# Patient Record
Sex: Female | Born: 1970 | Race: Black or African American | Hispanic: No | Marital: Married | Smoking: Former smoker
Health system: Southern US, Community
[De-identification: ages and names within clinical notes are randomized; demographics above are authoritative.]

## PROBLEM LIST (undated history)

## (undated) DIAGNOSIS — K219 Gastro-esophageal reflux disease without esophagitis: Secondary | ICD-10-CM

## (undated) DIAGNOSIS — Z8619 Personal history of other infectious and parasitic diseases: Secondary | ICD-10-CM

## (undated) DIAGNOSIS — R519 Headache, unspecified: Secondary | ICD-10-CM

## (undated) DIAGNOSIS — I10 Essential (primary) hypertension: Secondary | ICD-10-CM

## (undated) DIAGNOSIS — Z973 Presence of spectacles and contact lenses: Secondary | ICD-10-CM

## (undated) DIAGNOSIS — D259 Leiomyoma of uterus, unspecified: Secondary | ICD-10-CM

## (undated) DIAGNOSIS — Z972 Presence of dental prosthetic device (complete) (partial): Secondary | ICD-10-CM

## (undated) DIAGNOSIS — R51 Headache: Secondary | ICD-10-CM

## (undated) HISTORY — DX: Gastro-esophageal reflux disease without esophagitis: K21.9

## (undated) HISTORY — PX: NO PAST SURGERIES: SHX2092

---

## 2010-02-04 ENCOUNTER — Ambulatory Visit: Payer: Self-pay | Admitting: Family Medicine

## 2010-02-04 DIAGNOSIS — H6690 Otitis media, unspecified, unspecified ear: Secondary | ICD-10-CM | POA: Insufficient documentation

## 2010-02-04 DIAGNOSIS — J018 Other acute sinusitis: Secondary | ICD-10-CM | POA: Insufficient documentation

## 2010-07-30 ENCOUNTER — Encounter: Admission: RE | Admit: 2010-07-30 | Discharge: 2010-07-30 | Payer: Self-pay | Admitting: Internal Medicine

## 2010-08-14 ENCOUNTER — Encounter: Admission: RE | Admit: 2010-08-14 | Discharge: 2010-08-14 | Payer: Self-pay | Admitting: Internal Medicine

## 2010-08-15 ENCOUNTER — Encounter: Admission: RE | Admit: 2010-08-15 | Discharge: 2010-08-15 | Payer: Self-pay | Admitting: Internal Medicine

## 2010-10-25 ENCOUNTER — Encounter: Payer: Self-pay | Admitting: Internal Medicine

## 2010-11-04 NOTE — Letter (Signed)
Summary: Out of Work  [facility]  [address]   [place], [place] [address]   Phone: [phone]  Fax: [phone]    Feb 04, 2010   Employee:  [name]    To Whom It May Concern:   For Medical reasons, please excuse the above named employee from work for the following dates:  Start:    End:    If you need additional information, please feel free to contact our office.         Sincerely,    Tacey Ruiz MD

## 2010-11-04 NOTE — Assessment & Plan Note (Signed)
Summary: SINUSES/JBB   Vital Signs:  Patient Profile:   40 Years Old Female CC:      Cold & URI symptoms Height:     64 inches Weight:      277 pounds BMI:     47.72 Temp:     97.8 degrees F oral Pulse rate:   80 / minute Pulse rhythm:   regular Resp:     18 per minute BP sitting:   134 / 80  (left arm)  Pt. in pain?   no  Vitals Entered By: Levonne Spiller EMT-P (Feb 04, 2010 3:04 PM)              Is Patient Diabetic? No Comments Pt. quit smoking 3 months prior. Pt. has hx GERD./RWT      Current Allergies: No known allergies History of Present Illness History from: patient Chief Complaint: Cold & URI symptoms History of Present Illness: Since Sunday has had headache - described as frontal pressure and pressure behind the nose. Says that the congestion and pressure comes and goes.  Has taken aleve and that worked only temporarily. Hasn't tried otc meds.  Slight rhinorrhea, horseness and sorethroat. No sneezing. No fever/chills.  Current Problems: OTITIS MEDIA, ACUTE (ICD-382.9) OTHER ACUTE SINUSITIS (ICD-461.8)   Current Meds PROTONIX 40 MG TBEC (PANTOPRAZOLE SODIUM)  ZITHROMAX Z-PAK 250 MG TABS (AZITHROMYCIN) as directed for infection  REVIEW OF SYSTEMS Constitutional Symptoms      Denies fever and chills.  Ear/Nose/Throat/Mouth       Complains of frequent runny nose and hoarseness.      Denies change in hearing, ear pain, dizziness, frequent nose bleeds, sinus problems, and sore throat.  Respiratory       Denies dry cough and shortness of breath.     Neurological       Complains of headaches.    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 sounds congested. Head: normocephalic, atraumatic Ears: inflamed left TM Nasal: swollen red turbinates with congestion Oral/Pharynx: +  thick white post nasal drainage.  Neck: neck supple,  trachea midline, no masses Chest/Lungs: no rales, wheezes, or rhonchi bilateral, breath sounds equal without effort Heart: regular rate and  rhythm, no murmur Tender to palp max sinuses no erythema or swelling.  Assessment New Problems: OTITIS MEDIA, ACUTE (ICD-382.9) OTHER ACUTE SINUSITIS (ICD-461.8)   Plan New Medications/Changes: ZITHROMAX Z-PAK 250 MG TABS (AZITHROMYCIN) as directed for infection  #1 pk x 0, 02/04/2010, Tacey Ruiz MD  New Orders: New Patient Level II [phone] Planning Comments:   Discussed taking otc decongestants and alleve for now. Discussed a nasal wash. Give that 2-3 days, if no relief then may take zpak as prescribed.  Follow Up: Follow up in 2-3 days if no improvement Work/School Excuse: Return to work/school tomorrow  The patient and/or caregiver has been counseled thoroughly with regard to medications prescribed including dosage, schedule, interactions, rationale for use, and possible side effects and they verbalize understanding.  Diagnoses and expected course of recovery discussed and will return if not improved as expected or if the condition worsens. Patient and/or caregiver verbalized understanding.  Prescriptions: ZITHROMAX Z-PAK 250 MG TABS (AZITHROMYCIN) as directed for infection  #1 pk x 0   Entered and Authorized by:   Tacey Ruiz MD   Signed by:   Tacey Ruiz MD on 02/04/2010   Method  used:   Electronically to        [facility] (retail)       [address], [address]       [place], [place]  [address]       Ph: [phone]       Fax: [phone]   RxID:   [id number]   Patient Instructions: 1)  Take your antibiotic as prescribed until ALL of it is gone, but stop if you develop a rash or swelling and contact our office as soon as possible. 2)  Acute sinusitis symptoms for less than 10 days are not helped by antibiotics.Use warm moist  compresses, and over the counter decongestants ( only as directed). Call if no improvement in 5-7 days, sooner if increasing pain, fever, or new symptoms.  _________________________________________________________ I have reviewed the above medical office visit documention, including diagnoses, history, medications, clinical lists, orders and plan of care.   Rodney Langton, MD, FAAFP  Feb 05, 2010      Rodney Langton, M.D., F.A.A.F.P.  Feb 05, 2010

## 2010-11-04 NOTE — Letter (Signed)
Summary: Out of Work  [facility]  [address]   [place], [place] [address]   Phone: [phone]  Fax: [phone]    Feb 04, 2010   Employee:  Aliha Qadir    To Whom It May Concern:   For Medical reasons, please excuse the above named employee from work for the following dates:  Start:    End:    If you need additional information, please feel free to contact our office.         Sincerely,    Marieke Lubke MD 

## 2011-03-05 ENCOUNTER — Observation Stay (HOSPITAL_COMMUNITY)
Admission: EM | Admit: 2011-03-05 | Discharge: 2011-03-06 | Disposition: A | Discharge status: Home / Self Care | Payer: 59 | Attending: Internal Medicine | Admitting: Internal Medicine

## 2011-03-05 ENCOUNTER — Encounter (HOSPITAL_COMMUNITY): Payer: Self-pay | Admitting: Radiology

## 2011-03-05 ENCOUNTER — Emergency Department (HOSPITAL_COMMUNITY): Payer: 59

## 2011-03-05 DIAGNOSIS — K219 Gastro-esophageal reflux disease without esophagitis: Secondary | ICD-10-CM | POA: Insufficient documentation

## 2011-03-05 DIAGNOSIS — R0789 Other chest pain: Principal | ICD-10-CM | POA: Insufficient documentation

## 2011-03-05 DIAGNOSIS — E669 Obesity, unspecified: Secondary | ICD-10-CM | POA: Insufficient documentation

## 2011-03-05 DIAGNOSIS — R0602 Shortness of breath: Secondary | ICD-10-CM | POA: Insufficient documentation

## 2011-03-05 LAB — DIFFERENTIAL
Basophils Absolute: 0 10*3/uL (ref 0.0–0.1)
Basophils Relative: 0 % (ref 0–1)
Eosinophils Absolute: 0.1 10*3/uL (ref 0.0–0.7)
Monocytes Relative: 9 % (ref 3–12)
Neutro Abs: 3.4 10*3/uL (ref 1.7–7.7)
Neutrophils Relative %: 52 % (ref 43–77)

## 2011-03-05 LAB — COMPREHENSIVE METABOLIC PANEL
AST: 11 U/L (ref 0–37)
Albumin: 3 g/dL — ABNORMAL LOW (ref 3.5–5.2)
Alkaline Phosphatase: 73 U/L (ref 39–117)
BUN: 12 mg/dL (ref 6–23)
CO2: 27 mEq/L (ref 19–32)
Chloride: 104 mEq/L (ref 96–112)
GFR calc Af Amer: >60 mL/min (ref >60)
GFR calc non Af Amer: >60 mL/min (ref >60)
Potassium: 3.9 mEq/L (ref 3.5–5.1)
Total Bilirubin: 0.1 mg/dL — ABNORMAL LOW (ref 0.3–1.2)

## 2011-03-05 LAB — CBC
MCH: 27.1 pg (ref 26.0–34.0)
Platelets: 290 10*3/uL (ref 150–400)
RBC: 4.62 MIL/uL (ref 3.87–5.11)
WBC: 6.6 10*3/uL (ref 4.0–10.5)

## 2011-03-05 LAB — PROTIME-INR: Prothrombin Time: 12.1 seconds (ref 11.6–15.2)

## 2011-03-05 LAB — TROPONIN I: Troponin I: <0.3 ng/mL (ref <0.30)

## 2011-03-05 LAB — APTT: aPTT: 29 seconds (ref 24–37)

## 2011-03-05 LAB — D-DIMER, QUANTITATIVE: D-Dimer, Quant: 0.49 ug/mL-FEU — ABNORMAL HIGH (ref 0.00–0.48)

## 2011-03-05 NOTE — H&P (Signed)
NAMEKOOPER, [name]               ACCOUNT NO.:  [id number]  MEDICAL RECORD NO.:  [id number]           PATIENT TYPE:  E  LOCATION:  [facility]                         FACILITY:  [facility]  PHYSICIAN:  Talmage Nap, MD  DATE OF BIRTH:  Sep 05, 1971  DATE OF ADMISSION:  03/05/2011 DATE OF DISCHARGE:                             HISTORY & PHYSICAL   PRIMARY CARE PHYSICIAN:  Unknown.  History obtainable from the patient.  CHIEF COMPLAINT:  Chest pain post travel (from [place]) of two days' duration.  HISTORY OF PRESENT ILLNESS:  The patient is a 40 year old obese African American female with history of GERD was said to have developed chest pain after coming back from travel ([place]).  The patient claims she had been in stable health until 24 hours after traveling from [place] to [place].  She developed pain underneath her left breast which she describes as pleuritic with associated shortness of breath.  Pain was said to be about 8/10 in intensity and was radiating retrosternally and also to the right side of the chest.  This was said to be associated with shortness of breath.No history of cough.She however denied any history of diaphoresis.  She denied any nausea or vomiting.No fever.No chills. No rigor, but pain was said to be getting progressively worse with difficulty in breathing, hence she presented to the emergency room to be evaluated.  PAST MEDICAL HISTORY:  Positive for obesity and GERD.  PAST SURGICAL HISTORY:  No known past surgical history.  PREADMISSION MEDICATIONS:  Protonix 40 mg p.o. daily.  ALLERGIES:  She has no known allergies.  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  FAMILY HISTORY:  Positive for coronary artery disease.  REVIEW OF SYSTEMS:  The patient denies any history of headaches.  No blurred vision.  No nausea or vomiting.  Complained of left-sided chest pain  radiating to the right chest with associated shortness of breath. She denied any diaphoresis.  No fever.  No chills.  No rigor.  No cough. Denies any abdominal discomfort.  No diarrhea or hematochezia.  No dysuria or hematuria.  Complained about swelling of the lower extremity. No intolerance to heat or cold and no neuropsychiatric disorder.  PHYSICAL EXAMINATION:  GENERAL:  Very pleasant lady, obese, in painful distress. VITAL SIGNS:  Blood pressure is 148/92, pulse is 81, respiratory 22, and pulse ox is 98 on room air. HEENT:  Pupils are reactive to light and extraocular muscles are intact. NECK:  No jugular venous distention.  No carotid bruit.  No lymphadenopathy. CHEST:  Clear to auscultation. HEART:  Sounds 1 and 2. ABDOMEN:  Obese, nontender.  Liver, spleen, and kidney not palpable. Bowel sounds are positive. EXTREMITIES:  Showed trace pedal edema. NEUROLOGIC:  Nonfocal. MUSCULOSKELETAL:  Unremarkable. SKIN:  Normal turgor.  LABORATORY DATA:  At the moment, no available labs.  IMPRESSION: 1. Chest pain, rule out pulmonary embolism (based on history of travel     from [place]). 2. Obesity. 3. Gastroesophageal reflux disease.  PLAN:  Admit the patient to telemetry.  The  patient will be given aspirin 325 mg p.o. daily, nitroglycerin 0.4 mg sublingual p.r.n. for chest pain, morphine 2 mg IV p.r.n. for shortness of breath and chest pain.  She will be on O2 via nasal cannula 3 L per minute.  She also will be on Lopressor 25 mg p.o. b.i.d.  Other medication to be given to the patient will include Xanax 0.2 mg p.o. b.i.d. and she will be on GI prophylaxis with Protonix 40 mg IV q.24 h.  The patient will empirically be started therapeutic on Lovenox that is 1 mg/kg subcu q.12 h. stat dose now pending the results of CT thorax with PE protocol as well as D- dimer.  Labs to be ordered will include cardiac enzymes q.6 h. x3, D- dimer stat, CBC, CMP, and magnesium stat.  CBC, CMP,  and magnesium will also be repeated in a.m.  Imaging study to be ordered will include chest x-ray stat, CT thorax with PE protocol, venous duplex of the lower extremity.  The patient will be reevaluated with lab result as well as imaging studies.  She will be followed and evaluated on day-to-day basis.     Talmage Nap, MD     CN/MEDQ  D:  03/05/2011  T:  03/05/2011  Job:  [id number]  Electronically Signed by Talmage Nap  on 03/05/2011 82:95:62 PM

## 2011-03-06 DIAGNOSIS — R079 Chest pain, unspecified: Secondary | ICD-10-CM

## 2011-03-06 DIAGNOSIS — R609 Edema, unspecified: Secondary | ICD-10-CM

## 2011-03-06 LAB — APTT: aPTT: 32 seconds (ref 24–37)

## 2011-03-06 LAB — COMPREHENSIVE METABOLIC PANEL
ALT: 10 U/L (ref 0–35)
Alkaline Phosphatase: 66 U/L (ref 39–117)
Chloride: 104 mEq/L (ref 96–112)
Glucose, Bld: 103 mg/dL — ABNORMAL HIGH (ref 70–99)
Potassium: 3.7 mEq/L (ref 3.5–5.1)
Sodium: 136 mEq/L (ref 135–145)
Total Bilirubin: 0.3 mg/dL (ref 0.3–1.2)
Total Protein: 6.3 g/dL (ref 6.0–8.3)

## 2011-03-06 LAB — CBC
HCT: 34.5 % — ABNORMAL LOW (ref 36.0–46.0)
Hemoglobin: 11.8 g/dL — ABNORMAL LOW (ref 12.0–15.0)
MCH: 27.3 pg (ref 26.0–34.0)
MCV: 79.7 fL (ref 78.0–100.0)
RBC: 4.33 MIL/uL (ref 3.87–5.11)

## 2011-03-06 LAB — DIFFERENTIAL
Lymphs Abs: 1.8 10*3/uL (ref 0.7–4.0)
Monocytes Relative: 9 % (ref 3–12)
Neutro Abs: 2.1 10*3/uL (ref 1.7–7.7)
Neutrophils Relative %: 48 % (ref 43–77)

## 2011-03-06 LAB — CARDIAC PANEL(CRET KIN+CKTOT+MB+TROPI): CK, MB: 1.2 ng/mL (ref 0.3–4.0)

## 2011-03-06 LAB — PROTIME-INR
INR: 0.97 (ref 0.00–1.49)
Prothrombin Time: 13.1 seconds (ref 11.6–15.2)

## 2011-03-06 LAB — MAGNESIUM: Magnesium: 2 mg/dL (ref 1.5–2.5)

## 2011-03-06 MED ORDER — IOHEXOL 350 MG/ML SOLN
100.0000 mL | Freq: Once | INTRAVENOUS | Status: AC | PRN
  Administered 2011-03-05: 100 mL via INTRAVENOUS

## 2011-03-10 ENCOUNTER — Encounter: Payer: Self-pay | Admitting: Cardiology

## 2011-03-17 ENCOUNTER — Other Ambulatory Visit (HOSPITAL_COMMUNITY): Payer: Self-pay | Admitting: Cardiology

## 2011-03-17 DIAGNOSIS — R072 Precordial pain: Secondary | ICD-10-CM

## 2011-03-18 ENCOUNTER — Ambulatory Visit (HOSPITAL_COMMUNITY): Payer: 59 | Attending: Cardiology | Admitting: Radiology

## 2011-03-18 ENCOUNTER — Other Ambulatory Visit (HOSPITAL_COMMUNITY): Payer: Self-pay | Admitting: Cardiology

## 2011-03-18 ENCOUNTER — Ambulatory Visit (HOSPITAL_BASED_OUTPATIENT_CLINIC_OR_DEPARTMENT_OTHER): Payer: 59 | Admitting: Radiology

## 2011-03-18 DIAGNOSIS — R0609 Other forms of dyspnea: Secondary | ICD-10-CM | POA: Insufficient documentation

## 2011-03-18 DIAGNOSIS — R0989 Other specified symptoms and signs involving the circulatory and respiratory systems: Secondary | ICD-10-CM

## 2011-03-18 DIAGNOSIS — R072 Precordial pain: Secondary | ICD-10-CM | POA: Insufficient documentation

## 2011-03-18 DIAGNOSIS — M6281 Muscle weakness (generalized): Secondary | ICD-10-CM | POA: Insufficient documentation

## 2011-03-18 MED ORDER — PERFLUTREN PROTEIN A MICROSPH IV SUSP
3.0000 mL | Freq: Once | INTRAVENOUS | Status: AC
  Administered 2011-03-18: 3 mL via INTRAVENOUS

## 2011-03-28 NOTE — Discharge Summary (Signed)
[name], [name]               ACCOUNT NO.:  [id number]  MEDICAL RECORD NO.:  [id number]           PATIENT TYPE:  O  LOCATION:  2037                         FACILITY:  [facility]  PHYSICIAN:  Marcellus Scott, MD     DATE OF BIRTH:  07-11-1971  DATE OF ADMISSION:  03/05/2011 DATE OF DISCHARGE:  03/06/2011                              DISCHARGE SUMMARY   PRIMARY CARE PHYSICIAN:  Dr. Soyla Dryer at [facility] [place].  DISCHARGE DIAGNOSES: 1. Atypical chest pain. 2. Gastroesophageal reflux disease. 3. Obesity.  DISCHARGE MEDICATIONS: 1. Multivitamins 1 tablet p.o. daily. 2. Pantoprazole 40 mg p.o. q.p.m.  IMAGING: 1. CT angiogram of the chest with contrast.  Impression:  Motion     artifacts at lung bases.  Otherwise, normal exam.  Specifically, no     evidence of pulmonary embolism. 2. Chest x-ray on Mar 05, 2011.  Impression:  No cardiopulmonary     disease or acute process seen. 3. Bilateral lower extremity venous Dopplers completed and no evidence     of DVT, SVT, or Baker cyst noted.  LABORATORY DATA:  Magnesium 2.  Cardiac enzymes cycled x3 and negative. Comprehensive metabolic panel only significant for glucose 103, albumin 2.7.  Coagulation indices within normal limits.  CBC; hemoglobin 11.8, hematocrit 34.5, white blood cell 4.4, platelets 273.  D-dimer was 0.49.  CONSULTATIONS:  Cardiology, Madolyn Frieze. Jens Som, MD, [facility]  DIET:  Heart-healthy diet.  ACTIVITY:  As tolerated.  COMPLAINTS:  None.  The patient denies any further chest pains or dyspnea.  PHYSICAL EXAMINATION:  GENERAL:  [name] is in no obvious distress. VITAL SIGNS:  Temperature 97.9 degrees Fahrenheit; pulse 76 per minute, regular; respirations 18 per minute, blood pressure 106/73 mmHg, and saturating at 97% on room air with telemetry showing sinus rhythm in the 70s. RESPIRATORY SYSTEM:  Clear.  No chest wall tenderness.  No increased work of  breathing. CARDIOVASCULAR SYSTEM:  First and second heart sounds heard, regular. No murmurs or JVD. ABDOMEN:  Nondistended, soft, and bowel sounds present. CENTRAL NERVOUS SYSTEM:  The patient is awake, alert, oriented x3 with no focal neurological deficits. EXTREMITIES:  With no asymmetric swelling or cyanosis, clubbing, or edema.  Grade 5/5 power.  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 She has history of gastroesophageal reflux disease which was confirmed on endoscopy.  She is not fully compliant with her Protonix according to her.  She recently traveled to and fro from [place] during the [place] Day weekend, but indicates that they took adequate breaks, stretched their legs during the trip.  There is no history of any chest trauma or lifting anything heavy.  Yesterday while at work in the hospital, she started having some pricking type of pain in the left armpit, burning heartburn pain in the middle of the chest, and pain underneath the right breast.  These were intermittent and fleeting in nature.  She took some Advil for mild headache too.  However, the symptoms kept on recurring, following which she  went down to the emergency room and was admitted for further evaluation.  The patient was admitted to telemetry which did not show any arrhythmia alarms.  Given her recent travel history, D-dimer was done which was positive and this was followed by a CT angiogram of the chest and bilateral lower extremity venous Dopplers which were negative.  Her EKG did not show any acute changes.  However, the patient indicates that she has a very strong family history of coronary artery disease.  Her father had his first MI at age 54 when he had four-vessel coronary artery bypass graft.  There have been multiple deaths related to coronary artery disease on her paternal side including her  father's brothers and sisters.  Given the strong family history, her overweight or obese status, and history of smoking in the past, a Cardiology consultation was requested for risk stratification.  This dictator has discussed her case with the cardiologist who has cleared her for discharge and has arranged for an outpatient treadmill stress echo.  He does not recommend any aspirin or additional medications.  They have also cleared her for return to her usual work on Monday of next week. She has been advised to be compliant with her Protonix.  DISPOSITION:  The patient is discharged home in stable condition.  FOLLOWUP RECOMMENDATIONS: 1. With Dr. Olga Millers on April 02, 2011, at 4:15 p.m. 2. For a treadmill stress echo on March 18, 2011, at 3 p.m. 3. With Dr. Soyla Dryer.  The patient is to call for an     appointment.  Time taken in coordinating this discharge is 30 minutes.     Marcellus Scott, MD     AH/MEDQ  D:  03/06/2011  T:  03/07/2011  Job:  [id number]  cc:   Dr. Velta Addison. Jens Som, MD, [facility]  Electronically Signed by Marcellus Scott MD on 03/28/2011 01:38:51 AM

## 2011-04-01 ENCOUNTER — Encounter: Payer: Self-pay | Admitting: Cardiology

## 2011-04-02 ENCOUNTER — Encounter: Payer: 59 | Admitting: Cardiology

## 2011-04-02 NOTE — Consult Note (Signed)
NAMEMALAYSHIA, ALL               ACCOUNT NO.:  [id number]  MEDICAL RECORD NO.:  [id number]  LOCATION:                                 FACILITY:  PHYSICIAN:  Madolyn Frieze. Jens Som, MD, FACCDATE OF BIRTH:  Dec 01, 1970  DATE OF CONSULTATION:  03/06/2011 DATE OF DISCHARGE:                                CONSULTATION   PRIMARY CARE PHYSICIAN:  Soyla Dryer, MD, in [place].  PRIMARY CARDIOLOGIST:  New and will be Madolyn Frieze. Jens Som, MD, [facility]  CHIEF COMPLAINT:  Chest pain.  HISTORY OF PRESENT ILLNESS:  Ms. Brierley is a 40 year old female with no previous history of coronary artery disease.  She had chest pain 2 years ago and thought it was reflux.  She has a history of burning upper chest pain occasionally that is relieved by antacids.  The last time was about a month ago.  Yesterday, she had needle-like pain in her left axilla. It was like her previous episodes of chest pain 2 years ago.  It resolved spontaneously.  She had a headache and took two Advil. Approximately 30 minutes later, she had burning 5/10 substernal chest pain.  She also had a stabbing 5/10 substernal chest pain.  The stabbing chest pain eased off without intervention in about 30 seconds.  She took two Tums and the burning resolved in less than 5 minutes.  She then had right-sided chest pain below her right breast that reached a 7/10.  It was also stabbing and lasted about a minute.  She has had similar symptoms to this in the past but not this bad.  Previously, she would get symptoms about once a month that were not associated with exertion and the pain would reach a 4/10.  She had shortness of breath with the symptoms but no nausea, vomiting, or diaphoresis.  She occasionally carries bags and walks during the course of her work without dyspnea on exertion or chest pain.  She came to the emergency room after the most severe pain and was admitted for further evaluation.  She has not had any recurrent symptoms.  PAST  MEDICAL HISTORY: 1. Morbid obesity with a body mass index of 50. 2. Unknown lipid status. 3. Gastroesophageal reflux disease. 4. History of uterine fibroids. 5. Family history of premature coronary artery disease..  SURGICAL HISTORY:  None.  ALLERGIES:  No known drug allergies.  Medications prior to admission included an occasional multivitamins and Protonix which she was not consistent about taking daily.  SOCIAL HISTORY:  She lives in [place] with her husband.  She works for the [employer] as a Sports coach.  She has approximately 15 pack-year history of tobacco use and quit a year ago and denies alcohol or drug abuse.  FAMILY HISTORY:  Her parents are both alive at age 40.  Her father had coronary artery disease diagnosed at 42 years old and required bypass surgery.  He made multiple lifestyle changes and is currently doing well.  Neither her mother nor any siblings have coronary artery disease.  REVIEW OF SYSTEMS:  She had a recent car trip and had lower extremity edema after that took several days to resolve.  She has not had any  new dyspnea on exertion or shortness of breath.  She has occasional arthralgias and joint pains.  She has regular reflux symptoms but has never had melena.  She has had no recent illnesses, fevers, or chills. Full 14-point review of systems is otherwise negative except as stated in the HPI.  PHYSICAL EXAMINATION:  VITAL SIGNS:  Temperature is 97.9, blood pressure 106/73, heart rate 76, respiratory rate 18, O2 saturation 97% on room air. GENERAL:  She is a well-developed obese African American female in no acute distress. HEENT:  Normal. NECK:  There is no lymphadenopathy, thyromegaly, bruit, or JVD noted. CVA:  Her heart is regular in rate and rhythm with an S1 and S2, and there is a question of a soft systolic murmur but this is not clinically significant.  Distal pulses are intact in all four extremities. LUNGS:  Clear to  auscultation bilaterally. SKIN:  No rashes or lesions are noted. ABDOMEN:  Soft and nontender with active bowel sounds. EXTREMITIES:  There is no cyanosis, clubbing, or edema noted MUSCULOSKELETAL:  There is no joint deformity or effusions and no spine or CVA tenderness. NEUROLOGIC:  She is alert and oriented.  Cranial nerves II through XII grossly intact.  Chest x-ray no acute disease.  CT angiogram of the chest done because of an elevated D-dimer showed motion artifact but no PE.  EKG sinus rhythm, rate 82, with no acute ischemic changes.  LABORATORY VALUES:  Cardiac enzymes negative x3.  Hemoglobin 11.8, hematocrit 34.5, WBCs 4.4, platelets 273.  Sodium 136, potassium 3.7, chloride 104, CO2 24, BUN 11, creatinine 0.59, glucose 103.  INR 0.97. D-dimer 0.49.  IMPRESSION:  Ms. Decelles was seen today by Dr. Jens Som, the patient evaluated, and the data reviewed.  She is a 40 year old female with a past medical history of reflux whom we are asked to evaluate for chest pain.  She had several types of chest pain yesterday, left breast, substernal, and right breast area.  There was no nausea, vomiting, shortness of breath, diaphoresis.  The pain was not pleuritic or positional and not exertional.  The longest episode was less than 5 minutes.  She has had no chest pain since arrival.  Cardiac enzymes are negative, and her EKG is normal sinus rhythm with no ST changes.  CT was negative for pulmonary embolus.  Her chest pain is very atypical and possibly musculoskeletal. We will plan an outpatient stress echo which has been scheduled for March 18, 2011, at 3 p.m.  She is to follow up with Dr. Jens Som on April 02, 2011, at 4:15.     Theodore Demark, PA-C   ______________________________ Madolyn Frieze. Jens Som, MD, [facility]    RB/MEDQ  D:  03/06/2011  T:  03/07/2011  Job:  [id number]  Electronically Signed by Theodore Demark PA-C on 04/01/2011 12:42:41 PM Electronically Signed by Olga Millers  MD [facility] on 04/02/2011 05:51:35 AM

## 2011-04-24 ENCOUNTER — Encounter: Payer: Self-pay | Admitting: *Deleted

## 2011-04-27 ENCOUNTER — Encounter: Payer: Self-pay | Admitting: Cardiology

## 2011-04-27 ENCOUNTER — Ambulatory Visit (INDEPENDENT_AMBULATORY_CARE_PROVIDER_SITE_OTHER): Payer: 59 | Admitting: Cardiology

## 2011-04-27 DIAGNOSIS — E669 Obesity, unspecified: Secondary | ICD-10-CM | POA: Insufficient documentation

## 2011-04-27 DIAGNOSIS — R079 Chest pain, unspecified: Secondary | ICD-10-CM | POA: Insufficient documentation

## 2011-04-27 NOTE — Progress Notes (Signed)
HPI:40 year old female I initially saw in the hospital in June of 2012 secondary to atypical chest pain. D-dimer elevated but chest CT showed no pulmonary embolus. Enzymes negative. Outpatient stress echocardiogram in June 2012 was normal. Since that time, the patient denies any dyspnea on exertion, orthopnea, PND, pedal edema, palpitations, syncope or chest pain.   Current Outpatient Prescriptions  Medication Sig Dispense Refill  . pantoprazole (PROTONIX) 40 MG tablet Take 40 mg by mouth.           Past Medical History  Diagnosis Date  . Obesity   . GERD (gastroesophageal reflux disease)     No past surgical history on file.  History   Social History  . Marital Status: Married    Spouse Name: N/A    Number of Children: N/A  . Years of Education: N/A   Occupational History  . Nurse [employer]   Social History Main Topics  . Smoking status: Former Games developer  . Smokeless tobacco: Not on file   Comment: quit 2011  . Alcohol Use: 0.0 oz/week     once in a while  . Drug Use: No  . Sexually Active: Not on file   Other Topics Concern  . 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    ROS: no fevers or chills, productive cough, hemoptysis, dysphasia, odynophagia, melena, hematochezia, dysuria, hematuria, rash, seizure activity, orthopnea, PND, pedal edema, claudication. Remaining systems are negative.  Physical Exam: Well-developed obese in no acute distress.  Skin is warm and dry.  HEENT is normal.  Neck is supple. No thyromegaly.  Chest is clear to auscultation with normal expansion.  Cardiovascular exam is regular rate and rhythm.  Abdominal exam nontender or distended. No masses palpated. Extremities show no edema. neuro grossly intact  ECG Sinus rhythm at a rate of 83. No ST changes.

## 2011-04-27 NOTE — Assessment & Plan Note (Signed)
Weight loss discussed including diet and exercise.

## 2011-04-27 NOTE — Assessment & Plan Note (Signed)
No further symptoms. Chest CT showed no pulmonary embolus. Stress echocardiogram normal. No further evaluation.

## 2011-09-15 ENCOUNTER — Emergency Department (HOSPITAL_COMMUNITY)
Admission: EM | Admit: 2011-09-15 | Discharge: 2011-09-15 | Disposition: A | Discharge status: Home / Self Care | Payer: 59 | Source: Home / Self Care | Attending: Family Medicine | Admitting: Family Medicine

## 2011-09-15 ENCOUNTER — Encounter (HOSPITAL_COMMUNITY): Payer: Self-pay | Admitting: Cardiology

## 2011-09-15 DIAGNOSIS — H6980 Other specified disorders of Eustachian tube, unspecified ear: Secondary | ICD-10-CM

## 2011-09-15 HISTORY — DX: Essential (primary) hypertension: I10

## 2011-09-15 MED ORDER — FLUTICASONE PROPIONATE 50 MCG/ACT NA SUSP: 2.0000 | Freq: Every day | NASAL | Status: DC

## 2011-09-15 NOTE — ED Provider Notes (Signed)
History     CSN: [id number] Arrival date & time: 09/15/2011  8:16 AM   First MD Initiated Contact with Patient 09/15/11 0815      Chief Complaint  Patient presents with  . Sore Throat    (Consider location/radiation/quality/duration/timing/severity/associated sxs/prior treatment) HPI Comments: [name] presents for evaluation of LEFT ear pain and fullness with LEFT sided throat pain over the last few days. She denies any other symptoms but does report a URI several weeks ago. She also reports a hx of seasonal allergies. She denies fever, cough, sneezing.  Patient is a 40 y.o. female presenting with pharyngitis. The history is provided by the patient.  Sore Throat This is a new problem. The current episode started more than 2 days ago. The problem occurs constantly. The problem has not changed since onset.Pertinent negatives include no shortness of breath. The symptoms are aggravated by swallowing. The symptoms are relieved by nothing. She has tried acetaminophen for the symptoms. The treatment provided no relief.    Past Medical History  Diagnosis Date  . Obesity   . GERD (gastroesophageal reflux disease)   . Hypertension     History reviewed. No pertinent past surgical history.  Family History  Problem Relation Age of Onset  . Coronary artery disease      father side of family  . Stroke Mother   . Coronary artery disease Father   . Hypertension Other     History  Substance Use Topics  . Smoking status: Former Smoker    Quit date: 10/05/2008  . Smokeless tobacco: Not on file   Comment: quit 2011  . Alcohol Use: 0.0 oz/week     once in a while    OB History    Grav Para Term Preterm Abortions TAB SAB Ect Mult Living                  Review of Systems  Constitutional: Negative.   HENT: Positive for ear pain, congestion and sore throat. Negative for hearing loss.   Eyes: Negative.   Respiratory: Negative for shortness of breath.   Gastrointestinal: Negative.    Genitourinary: Negative.   Musculoskeletal: Negative.   Skin: Negative.   Neurological: Negative.     Allergies  Review of patient's allergies indicates no known allergies.  Home Medications   Current Outpatient Rx  Name Route Sig Dispense Refill  . LISINOPRIL 5 MG PO TABS Oral Take 5 mg by mouth daily.      Marland Kitchen PANTOPRAZOLE SODIUM 40 MG PO TBEC Oral Take 40 mg by mouth.      Marland Kitchen FLUTICASONE PROPIONATE 50 MCG/ACT NA SUSP Nasal Place 2 sprays into the nose daily. 16 g 2    BP 151/92  Pulse 81  Temp(Src) 98.5 F (36.9 C) (Oral)  Resp 18  SpO2 100%  LMP 08/27/2011  Physical Exam  Nursing note and vitals reviewed. Constitutional: She is oriented to person, place, and time. She appears well-developed and well-nourished.  HENT:  Head: Normocephalic and atraumatic.  Right Ear: Tympanic membrane is retracted.  Left Ear: Tympanic membrane is retracted.  Mouth/Throat: Uvula is midline, oropharynx is clear and moist and mucous membranes are normal.  Eyes: EOM are normal.  Neck: Normal range of motion.  Pulmonary/Chest: Effort normal and breath sounds normal.  Musculoskeletal: Normal range of motion.  Lymphadenopathy:    She has no cervical adenopathy.  Neurological: She is alert and oriented to person, place, and time.  Skin: Skin is warm and dry.  Psychiatric: Her behavior is normal.    ED Course  Procedures (including critical care time)  Labs Reviewed - No data to display No results found.   1. Eustachian tube dysfunction       MDM          Richardo Priest, MD 09/29/11 1154

## 2011-09-15 NOTE — ED Notes (Signed)
Pt reports sore throat on left side going left ear. Denies fever. Tried throat lozengers with no relief. Symptoms started this past Friday.

## 2012-06-13 ENCOUNTER — Ambulatory Visit: Payer: 59 | Admitting: *Deleted

## 2012-06-21 NOTE — Progress Notes (Signed)
Patient attended the [facility]: Hypertension/High Cholesterol nutrition class on 06/13/12.  Topics covered include:   1. Complications of Hyperlipidemia and/or Hypertension. 2. Ways to reduce risk of heart disease.  3. Identifying fat and sodium content on food labels. 4. Ways to decrease sodium intake. 5. Optimal amount of daily saturated fat intake. 6. Optimal amount of daily sodium intake.  7. Foods to limit/avoid on a heart healthy diet.  Patient to follow-up with [facility] prn.

## 2013-07-03 ENCOUNTER — Encounter: Payer: Self-pay | Admitting: Cardiology

## 2015-10-06 DIAGNOSIS — Z8619 Personal history of other infectious and parasitic diseases: Secondary | ICD-10-CM

## 2015-10-06 HISTORY — DX: Personal history of other infectious and parasitic diseases: Z86.19

## 2017-10-13 ENCOUNTER — Encounter: Payer: BLUE CROSS/BLUE SHIELD | Attending: Surgery | Admitting: Dietician

## 2017-10-13 ENCOUNTER — Encounter: Payer: Self-pay | Admitting: Dietician

## 2017-10-13 VITALS — Ht 64.0 in | Wt 346.8 lb

## 2017-10-13 DIAGNOSIS — Z6841 Body Mass Index (BMI) 40.0 and over, adult: Secondary | ICD-10-CM

## 2017-10-13 NOTE — Progress Notes (Signed)
Nutritional Assessment:      Date: 10/13/17 Re: [name]  DOB:  Jul 14, 2071  MRN: [id number] Proposed Surgery: sleeve gastrectomy MD: Johnathan Hausen RD:  Erlene Quan, MEd, RD, LDN  Height 5'4"  Weight 346.8lbs  BMI 59.5  Upper IBW/% UIBW lbs/%  Patient's goal weight 160lbs  Medical History HTN, GERD  Medications/Supplements Lisinopril, Pantoprazole  Previous Surgeries None per patient  Drug Allergies None known  Food Allergies None known  Drink Alcohol occasional  Smoke Cigarettes None, quit 2010-01-04.   Physical Activity No structured activity, sedentary lifestyle for past year. Was going to gym regularly until job change last year. Does get up and move every hour at work, has watch alarm set.  Weight History Reports significant weight gain since 01/05/2015 after mother passed away; she resumed smoking for 2-3 weeks, then quit but increased snacking. Was last at goal weight in 1995/01/05. Gained some in 04-Jan-2010 when she initially quit smoking, but job was more active. Less cooking since children have grown and left home, more dining out.   Dieting/Weight Loss History  Has participated in weight watchers multiple times, lost weight and then regained. Tried keto diet which lasted only a few months.    Dietary Recall/Food and Fluid: [name] lives with husband.   She performs the food shopping and meal preparation. Has recently purchased Instantpot and plans to reduce restaurant meals. Reports investigating weight loss surgery for several years.  Dining out: 10 meals at hospital cafeteria, 3 restaurant meals weekly.  Typical eating pattern: 3 meals and 1-2 snacks daily  Breakfast: oatmeal with splenda and raisins; occasionally grits; egg and veggie scramble once a week; occasionally Premier protein drink Snack: usually none Lunch: grilled chicken and veggies, salad. Less bread.  Snack: sometimes small portion peanuts Supper: meat, veg, sweet potato; chicken sandwich and fries at [employer] tries to limit red  meat.  Snack: craves sweets-- has switched to fat free fig newtons or frozen yogurt Beverages: coffee, water, occasional diet soda  Psychosocial:                                                                                                                               She denies binge eating, or other disordered eating behaviors.  She does occasionally engage in extra snacking when feeling stressed.   Instruction:   [name] has been instructed on the following: . Pre-op dietary changes, particularly eliminating caffeine intake.  . Pre-op liver reduction diet.  . Overview of post-op diet changes, including importance of hydration and adequate protein.  Importance of recognizing bariatric surgery as a tool and not a solution, and having non-food healthy stress management strategies. . She was commended for the changes she has made thus far.   Intervention: . [name] is currently making low fat and low sugar food choices, and limiting intake of starchy foods. . She is making plans to reduce restaurant meals.  . She is working to  increase physical activity.  . She has researched this procedure and its complications by Speaking with others who have undergone bariatric surgery, read materials and websites. . She voices understanding of the instruction listed above.  Summary: [name] appears very motivated for surgery, as she has already made significant diet and lifestyle changes to lose weight and improve her lifestyle.  She will continue to work on limiting sweets, restaurant meals, and increasing physical activity. Due to these factors and solid support from friends and family, from a nutrition standpoint, she is ready to proceed with the bariatric surgery process.                                                                                                                  Plan: The patient is requested to participate in pre- and post-operative follow-up: monthly weight loss visits  for 3 months, pre-op class 2-4 weeks prior to surgery, and post-op visits beginning at 2-3 weeks post-op.   Next RD appointment: 11/17/17  If you have any questions or need any further information regarding [name], please feel free to contact me by phone, fax, or email.  Sincerely,    Erlene Quan, MEd, RD, LDN, CDE Nutrition and Diabetes Education [facility] Phone: [phone] [email or web address]

## 2017-10-13 NOTE — Patient Instructions (Addendum)
   Continue to make healthy food choices, concentrating on low-carb veggies, lean protein foods, and small portions of starches and/or fruits. Whole grains, beans, peas, sweet potatoes without added sugar are healthy starches.   Keep up daily movement and exercise as tolerated.   Start decreasing caffeine intake by trying half-caff coffee, or blend regular and decaf, or drink a smaller cup of coffee. Also try blending regular and decaf green tea.

## 2017-11-06 ENCOUNTER — Emergency Department: Payer: BLUE CROSS/BLUE SHIELD

## 2017-11-06 ENCOUNTER — Encounter: Payer: Self-pay | Admitting: Emergency Medicine

## 2017-11-06 ENCOUNTER — Emergency Department
Admission: EM | Admit: 2017-11-06 | Discharge: 2017-11-06 | Disposition: A | Discharge status: Home / Self Care | Payer: BLUE CROSS/BLUE SHIELD | Attending: Emergency Medicine | Admitting: Emergency Medicine

## 2017-11-06 ENCOUNTER — Other Ambulatory Visit: Payer: Self-pay

## 2017-11-06 DIAGNOSIS — N390 Urinary tract infection, site not specified: Secondary | ICD-10-CM | POA: Insufficient documentation

## 2017-11-06 DIAGNOSIS — Z87891 Personal history of nicotine dependence: Secondary | ICD-10-CM | POA: Insufficient documentation

## 2017-11-06 DIAGNOSIS — N83292 Other ovarian cyst, left side: Secondary | ICD-10-CM | POA: Diagnosis not present

## 2017-11-06 DIAGNOSIS — R109 Unspecified abdominal pain: Secondary | ICD-10-CM

## 2017-11-06 DIAGNOSIS — Z79899 Other long term (current) drug therapy: Secondary | ICD-10-CM | POA: Insufficient documentation

## 2017-11-06 DIAGNOSIS — I10 Essential (primary) hypertension: Secondary | ICD-10-CM | POA: Insufficient documentation

## 2017-11-06 DIAGNOSIS — R1032 Left lower quadrant pain: Secondary | ICD-10-CM | POA: Diagnosis present

## 2017-11-06 LAB — TROPONIN I

## 2017-11-06 LAB — HEPATIC FUNCTION PANEL
ALBUMIN: 3.2 g/dL — AB (ref 3.5–5.0)
ALT: 14 U/L (ref 14–54)
AST: 13 U/L — AB (ref 15–41)
Alkaline Phosphatase: 93 U/L (ref 38–126)
Bilirubin, Direct: <0.1 mg/dL — ABNORMAL LOW (ref 0.1–0.5)
Total Bilirubin: 0.5 mg/dL (ref 0.3–1.2)
Total Protein: 8.1 g/dL (ref 6.5–8.1)

## 2017-11-06 LAB — BASIC METABOLIC PANEL
Anion gap: 8 (ref 5–15)
BUN: 10 mg/dL (ref 6–20)
CO2: 25 mmol/L (ref 22–32)
Calcium: 8.6 mg/dL — ABNORMAL LOW (ref 8.9–10.3)
Chloride: 102 mmol/L (ref 101–111)
Creatinine, Ser: 0.75 mg/dL (ref 0.44–1.00)
Glucose, Bld: 126 mg/dL — ABNORMAL HIGH (ref 65–99)
POTASSIUM: 4.1 mmol/L (ref 3.5–5.1)
Sodium: 135 mmol/L (ref 135–145)

## 2017-11-06 LAB — URINALYSIS, COMPLETE (UACMP) WITH MICROSCOPIC
BACTERIA UA: NONE SEEN
BILIRUBIN URINE: NEGATIVE
Glucose, UA: NEGATIVE mg/dL
HGB URINE DIPSTICK: NEGATIVE
Ketones, ur: NEGATIVE mg/dL
Nitrite: NEGATIVE
PROTEIN: NEGATIVE mg/dL
Specific Gravity, Urine: 1.02 (ref 1.005–1.030)
pH: 6 (ref 5.0–8.0)

## 2017-11-06 LAB — CBC
HCT: 36.6 % (ref 35.0–47.0)
Hemoglobin: 11.9 g/dL — ABNORMAL LOW (ref 12.0–16.0)
MCH: 25.6 pg — ABNORMAL LOW (ref 26.0–34.0)
MCHC: 32.6 g/dL (ref 32.0–36.0)
MCV: 78.5 fL — ABNORMAL LOW (ref 80.0–100.0)
PLATELETS: 450 10*3/uL — AB (ref 150–440)
RBC: 4.66 MIL/uL (ref 3.80–5.20)
RDW: 15.2 % — AB (ref 11.5–14.5)
WBC: 7.9 10*3/uL (ref 3.6–11.0)

## 2017-11-06 LAB — LIPASE, BLOOD: Lipase: 22 U/L (ref 11–51)

## 2017-11-06 LAB — POCT PREGNANCY, URINE: PREG TEST UR: NEGATIVE

## 2017-11-06 MED ORDER — HYDROCODONE-ACETAMINOPHEN 5-325 MG PO TABS
2.0000 | ORAL_TABLET | Freq: Once | ORAL | Status: AC
  Administered 2017-11-06: 2 via ORAL
  Filled 2017-11-06: qty 2

## 2017-11-06 MED ORDER — HYDROCODONE-ACETAMINOPHEN 5-325 MG PO TABS: 1.0000 | ORAL_TABLET | ORAL | 0 refills | Status: DC | PRN

## 2017-11-06 MED ORDER — FENTANYL CITRATE (PF) 100 MCG/2ML IJ SOLN
50.0000 ug | INTRAMUSCULAR | Status: DC | PRN
  Administered 2017-11-06: 50 ug via INTRAVENOUS
  Filled 2017-11-06: qty 2

## 2017-11-06 MED ORDER — ONDANSETRON HCL 4 MG/2ML IJ SOLN
4.0000 mg | Freq: Once | INTRAMUSCULAR | Status: AC
  Administered 2017-11-06: 4 mg via INTRAVENOUS
  Filled 2017-11-06: qty 2

## 2017-11-06 MED ORDER — CEPHALEXIN 500 MG PO CAPS: 500.0000 mg | ORAL_CAPSULE | Freq: Three times a day (TID) | ORAL | 0 refills | Status: DC

## 2017-11-06 MED ORDER — KETOROLAC TROMETHAMINE 30 MG/ML IJ SOLN
15.0000 mg | Freq: Once | INTRAMUSCULAR | Status: AC
  Administered 2017-11-06: 15 mg via INTRAVENOUS
  Filled 2017-11-06: qty 1

## 2017-11-06 NOTE — ED Notes (Signed)
Patient unable to urinate at this time. Sample cup given to patient.

## 2017-11-06 NOTE — ED Notes (Signed)
Pt aware of need for urine specimen. 

## 2017-11-06 NOTE — Discharge Instructions (Signed)
As we discussed please follow-up with your OB/GYN for recheck/reevaluation.  Return to the emergency department for any worsening abdominal pain, fever, or any other symptom personally concerning to yourself.  Please take your antibiotic as prescribed for its entire course.  Take pain medication as needed, as written.

## 2017-11-06 NOTE — ED Triage Notes (Signed)
Patient to ER for c/o flank pain and pain to lower abdomen on left side. Patient states she was seen at PCP this week and told she had mild UTI, had urine culture sent at that time. Patient was not started on antibiotics at that time, waiting on urine culture results. Patient reports vomiting this am. Denies any fevers at any point.

## 2017-11-06 NOTE — ED Provider Notes (Signed)
[facility] Emergency Department Provider Note  Time seen: 10:23 AM  I have reviewed the triage vital signs and the nursing notes.   HISTORY  Chief Complaint Flank Pain    HPI [name] is a 47 y.o. female with a past medical history of gastric reflux, hypertension, obesity, presents to the emergency department with left flank pain.  According to the patient for the past 1 week she has been experiencing intermittent left flank pain, was seen by her doctor on Tuesday for a scheduled OB appointment at that time had a urine dip performed showing leukocytes and blood but only a very small amount per patient and a culture was sent with the patient states she was not started on antibiotics.  Patient denies any dysuria.  Denies any fever.  States this morning she became nauseated with several episodes of vomiting so she came to the emergency department for evaluation.  Denies any diarrhea, normal bowel movement this morning.  Denies black or bloody stool.  Largely negative review of systems otherwise.  He received pain medication in triage currently rates her pain as a 4-5 out of 10.  More of a dull pain in the left flank.   Past Medical History:  Diagnosis Date  . GERD (gastroesophageal reflux disease)   . Hypertension   . Obesity     Patient Active Problem List   Diagnosis Date Noted  . Chest pain 04/27/2011  . Obesity 04/27/2011  . OTITIS MEDIA, ACUTE 02/04/2010  . OTHER ACUTE SINUSITIS 02/04/2010    History reviewed. No pertinent surgical history.  Prior to Admission medications   Medication Sig Start Date End Date Taking? Authorizing Provider  lisinopril (PRINIVIL,ZESTRIL) 5 MG tablet Take 5 mg by mouth daily.      [provider]  pantoprazole (PROTONIX) 40 MG tablet Take 40 mg by mouth.      [provider]    No Known Allergies  Family History  Problem Relation Age of Onset  . Coronary artery disease Unknown        father  side of family  . Stroke Mother   . Coronary artery disease Father   . Hypertension Other     Social History Social History   Tobacco Use  . Smoking status: Former Smoker    Last attempt to quit: 10/05/2008    Years since quitting: 9.0  . Smokeless tobacco: Never Used  . Tobacco comment: quit 2011  Substance Use Topics  . Alcohol use: Yes    Alcohol/week: 0.0 oz    Comment: once in a while  . Drug use: No    Review of Systems Constitutional: Negative for fever. Eyes: Negative for visual complaints ENT: Negative for recent illness/congestion Cardiovascular: Negative for chest pain. Respiratory: Negative for shortness of breath. Gastrointestinal: Left flank pain wrapping around to her left mid abdomen.  Positive for nausea vomiting beginning this morning.  Negative for diarrhea.  No constipation. Genitourinary: Negative for urinary compaints.  Denies dysuria.  Denies hematuria.  No history of kidney stones. Musculoskeletal: Negative for musculoskeletal complaints Skin: Negative for skin complaints  Neurological: Mild headache yesterday, gone today. All other ROS negative  ____________________________________________   PHYSICAL EXAM:  VITAL SIGNS: ED Triage Vitals  Enc Vitals Group     BP 11/06/17 0857 (!) 186/86     Pulse Rate 11/06/17 0857 98     Resp 11/06/17 0857 20     Temp 11/06/17 0857 97.7 F (36.5 C)  Temp Source 11/06/17 0857 Oral     SpO2 11/06/17 0857 97 %     Weight --      Height 11/06/17 0858 5\' 4"  (1.626 m)     Head Circumference --      Peak Flow --      Pain Score 11/06/17 0857 8     Pain Loc --      Pain Edu? --      Excl. in [place]? --    Constitutional: Alert and oriented. Well appearing and in no distress. Eyes: Normal exam ENT   Head: Normocephalic and atraumatic   Mouth/Throat: Mucous membranes are moist. Cardiovascular: Normal rate, regular rhythm. No murmur Respiratory: Normal respiratory effort without tachypnea nor  retractions. Breath sounds are clear  Gastrointestinal: Soft, no distention.  Mild left mid abdominal tenderness to palpation.  No rebound or guarding.  No distention.  No CVA tenderness. Musculoskeletal: Nontender with normal range of motion in all extremities.  Neurologic:  Normal speech and language. No gross focal neurologic deficits Skin:  Skin is warm, dry and intact.  Psychiatric: Mood and affect are normal.   ____________________________________________   RADIOLOGY  CT shows 7 x 4 ovarian lesion likely hemorrhagic cyst versus endometrioma.  ____________________________________________   INITIAL IMPRESSION / ASSESSMENT AND PLAN / ED COURSE  Pertinent labs & imaging results that were available during my care of the patient were reviewed by me and considered in my medical decision making (see chart for details).  Patient presents to the emergency department for left flank pain intermittent over the past 1 week now with nausea and vomiting today.  Differential would include ureterolithiasis, pyelonephritis/urinary tract infection, musculoskeletal pain, colitis/diverticulitis.  We will check labs, CT scan of the abdomen/pelvis renal protocol, treat with Toradol for symptom management and continue to closely monitor.  Overall the patient appears well, no distress.  Patient states her pain is down to a 3/10 currently.  CT shows no ureterolithiasis, does show a 7 x 4 cm hyperdense left ovarian lesion.  I discussed with the patient the need to follow-up with an ultrasound to further characterize.  Patient states she had a transvaginal ultrasound performed on Tuesday and she was told this was just endometriosis.  This could and is likely the cause of the patient's discomfort.  She does not wish for a repeat ultrasound done today.  I discussed with the patient if her pain worsens she is to return to the emergency department immediately for further evaluation and likely ultrasound.  We will  discharge with short course of pain medication.  Given too numerous to count white blood cells in her urinalysis we will cover with Keflex for likely urinary tract infection.  Urine culture has been sent.  Patient agreeable to this plan of care. ____________________________________________   FINAL CLINICAL IMPRESSION(S) / ED DIAGNOSES  Left flank pain Ovarian lesion Urinary tract infection   Harvest Dark, MD 11/06/17 1134

## 2017-11-07 LAB — URINE CULTURE

## 2017-11-17 ENCOUNTER — Encounter: Payer: Self-pay | Admitting: Dietician

## 2017-11-17 ENCOUNTER — Encounter: Payer: BLUE CROSS/BLUE SHIELD | Attending: Surgery | Admitting: Dietician

## 2017-11-17 VITALS — Ht 64.0 in | Wt 335.5 lb

## 2017-11-17 DIAGNOSIS — Z6841 Body Mass Index (BMI) 40.0 and over, adult: Secondary | ICD-10-CM

## 2017-11-17 NOTE — Patient Instructions (Signed)
   Be gentle with digestive system until completely healed from H. Pylori infection -- small amounts of low fiber foods and lean proteins several times a day.   Try some probiotic food-- one of the best is Kefir, a yogurt drink found in the dairy case. There are also probiotic supplements if you need it, such as Culturelle, align, and others.   Gradually resume your regular healthy eating habits and activity as you feel better.

## 2017-11-17 NOTE — Progress Notes (Signed)
Appt start time: 1630 end time:  1700.  Assessment:   #2 SWL Appointment.   Start Wt at [place]: 346.8lbs   10/13/17 Wt: 335.5lbs BMI: 57.6  Preferred Learning Style:   No preference indicated   Learning Readiness:   Change in progress  MEDICATIONS: ketorolac (not currently taking lisinopril or pantoprazole)  Ms. Caprara reports being ill for the past several weeks, first with GI illness/ infection, which was followed by/ overlapped with UTI, and then ovarian hemorrhagic cyst.  DIETARY INTAKE: Intake is low due to recent illness 24-hr recall:  B ( AM): scrambled eggs, 1/2 cup coffee  Snk ( AM): cranberry juice   L ( PM): crackers, 1/4cup tuna Snk ( PM): none recently D ( PM): mashed potatoes, [place] steak planned for today-- might just eat potatoes. Snk ( PM): none Beverages: water, cranberry juice due to UTI, small amount of coffee  Usual physical activity: walking during workday until falling ill 3 weeks ago  Diet to Follow: Each meal: 30g carbohydrates (will reduce further next month) 1-4oz protein 10g added fat (or less)              Nutritional Diagnosis:  [place]-3.3 Overweight/obesity related to past poor dietary habits and physical inactivity as evidenced by BMI of 57.6 and patient report.              Intervention:  Nutrition counseling for upcoming Bariatric Surgery.    Advised low fat, low fiber diet temporarily as patient recovers from her GI symptoms.     Encouraged her to try a probiotic product such as Kefir or consider a supplement.   Teaching Method Utilized:  Visual Auditory   Handouts given during visit include:  Goals and instructions   Barriers to learning/adherence to lifestyle change: none  Demonstrated degree of understanding via:  Teach Back   Monitoring/Evaluation:  Dietary intake, exercise, and body weight 12/08/17.

## 2017-12-08 ENCOUNTER — Encounter: Payer: Self-pay | Admitting: Dietician

## 2017-12-08 ENCOUNTER — Encounter: Payer: BLUE CROSS/BLUE SHIELD | Attending: Surgery | Admitting: Dietician

## 2017-12-08 VITALS — Ht 64.0 in | Wt 342.6 lb

## 2017-12-08 DIAGNOSIS — Z6841 Body Mass Index (BMI) 40.0 and over, adult: Secondary | ICD-10-CM

## 2017-12-08 NOTE — Progress Notes (Signed)
Appt start time: 1630 end time:  1700.  Assessment:   #3 of 4 SWL Appointment.   Start Wt at [place]: 346.8lbs Wt: 342.6lbs BMI: 58.81  Preferred Learning Style:   Auditory  Visual  Hands on  Learning Readiness:   Change in progress  MEDICATIONS: ketorolac  Progress: Ms. Guimont has had ongoing nausea over the past month, particularly when smelling food odors. Her appetite remains low, improving very gradually. She has tried Kefir and probiotic yogurt.   DIETARY INTAKE:  24-hr recall:  B ( AM): 1 boiled egg and toast with flavored yogurt spread, 1/2-caffeine coffee with splenda; oatmeal with a few raisins.  Snk ( AM): none  L ( PM): soup with crackers or toast; or probiotic yogurt and fruit Snk ( PM): none D ( PM): soup and bread; sometimes none  Snk ( PM): none Beverages: mostly water  Usual physical activity: walked 5-10 minutes today; unable to exercise prior to today due to lingering effects from illness  Diet to Follow: 15-30g carbohydrate 1-4oz protein Low fat choices              Nutritional Diagnosis:  -3.3 Overweight/obesity related to past poor dietary habits and physical inactivity as evidenced by patient with BMI of 58.8, currently working to improve diet in advance of weight loss surgery.              Intervention:  Nutrition counseling for upcoming Bariatric Surgery.  Teaching Method Utilized:  Visual Auditory   Handouts given during visit include:  Goals and instructions   Barriers to learning/adherence to lifestyle change: none  Demonstrated degree of understanding via:  Teach Back   Monitoring/Evaluation:  Dietary intake, exercise, GI symptoms, and body weight 01/05/18.

## 2017-12-08 NOTE — Patient Instructions (Signed)
   Continue working to eat every 3-5 hours during day.   Keep taking probiotic yogurt or Kefir to continue with the healing process.   Also eat a protein choice several times daily.   Try eating mostly cool, fresh foods to avoid unpleasant odors. Low fat cheese, fruits with cheese, [place] sandwich, etc.

## 2017-12-27 ENCOUNTER — Other Ambulatory Visit: Payer: Self-pay | Admitting: Obstetrics and Gynecology

## 2017-12-27 DIAGNOSIS — R928 Other abnormal and inconclusive findings on diagnostic imaging of breast: Secondary | ICD-10-CM

## 2017-12-30 ENCOUNTER — Ambulatory Visit: Payer: BLUE CROSS/BLUE SHIELD

## 2017-12-30 ENCOUNTER — Ambulatory Visit
Admission: RE | Admit: 2017-12-30 | Discharge: 2017-12-30 | Disposition: A | Discharge status: Home / Self Care | Payer: BLUE CROSS/BLUE SHIELD | Source: Ambulatory Visit | Attending: Obstetrics and Gynecology | Admitting: Obstetrics and Gynecology

## 2017-12-30 DIAGNOSIS — R928 Other abnormal and inconclusive findings on diagnostic imaging of breast: Secondary | ICD-10-CM

## 2018-01-05 ENCOUNTER — Encounter: Payer: BLUE CROSS/BLUE SHIELD | Attending: Surgery | Admitting: Dietician

## 2018-01-05 ENCOUNTER — Encounter: Payer: Self-pay | Admitting: Dietician

## 2018-01-05 VITALS — Ht 64.0 in | Wt 342.1 lb

## 2018-01-05 DIAGNOSIS — Z6841 Body Mass Index (BMI) 40.0 and over, adult: Secondary | ICD-10-CM | POA: Diagnosis not present

## 2018-01-05 NOTE — Patient Instructions (Signed)
   Continue to limit carbs to 15-30grams with each meal.   Great job increasing exercise, keep it up!

## 2018-01-05 NOTE — Progress Notes (Signed)
Appt start time: 1645 end time:  1715.  Assessment:   #4 of 4 SWL Appointment.   Start Wt at [place]: 346.8lbs Wt: 342.1lbs (with shoes, other weights were without shoes) BMI: 58.72  Preferred Learning Style:   Auditory  Visual  Hands on  Learning Readiness:   Change in progress  MEDICATIONS: ketorolac, LUPRON DEPOT injection  DIETARY INTAKE:  Patient reports improved appetite in the past month; will still occasionally skip a meal if not hungry.   24-hr recall:  Breakast: egg Mcmuffin; Premier protein shake Snack: none  Lunch: sandwich on white wheat with tuna; or salad; occasionally none Snack: usually none; occasionally fruit i.e cuties orange(s), or pistachios Dinner: protein often fish baked or air-fried with light cornmeal coating; chicken tenders with salad Snack: none Beverages: water, 1c coffee 1/2-caff in am  Usual physical activity: walking 40 minutes, 3 times a week, gradually increasing frequency  Diet to Follow: 15-30g carbohydrates 1-4oz protein Low fat choices               Nutritional Diagnosis:  [place]-3.3 Overweight/obesity related to history of excess calories and inactivity as evidenced by patient with BMI of 58.7 currently making lifestyle changes to prepare for upcoming bariatric surgery.              Intervention:  Nutrition counseling for upcoming Bariatric Surgery   Patient is working on positive diet changes and is increasing her physical activity.   She has established support from family and friends,   She is ready to proceed towards having weight loss surgery.   Teaching Method Utilized:  Visual Auditory  Handouts given during visit include:  Planning a Balanced Meal food lists with basic meal plan  Goals and instructions  Barriers to learning/adherence to lifestyle change: none  Demonstrated degree of understanding via:  Teach Back   Monitoring/Evaluation:  Dietary intake, exercise, and body weight 01/21/18 for pre-op class.

## 2018-01-10 ENCOUNTER — Other Ambulatory Visit: Payer: Self-pay | Admitting: Surgery

## 2018-01-10 ENCOUNTER — Other Ambulatory Visit (HOSPITAL_COMMUNITY): Payer: Self-pay | Admitting: Surgery

## 2018-01-21 ENCOUNTER — Encounter (INDEPENDENT_AMBULATORY_CARE_PROVIDER_SITE_OTHER): Payer: BLUE CROSS/BLUE SHIELD

## 2018-01-21 VITALS — Wt 345.1 lb

## 2018-01-21 DIAGNOSIS — Z6841 Body Mass Index (BMI) 40.0 and over, adult: Secondary | ICD-10-CM | POA: Diagnosis not present

## 2018-01-21 NOTE — Progress Notes (Signed)
Pre-Operative Nutrition Class:  Appt start time: 9000   End time:  1000.  Patient was seen on 01/21/18 for Pre-Operative Bariatric Surgery Education at [facility].   Surgery date: to be determined Surgery type: Sleeve Gastrectomy Start weight at [facility]: 346.8lb Weight today: 345.1lb  Samples given per MNT protocol. Patient educated on appropriate usage:  Celebrate Vitamins Multivitamin Lot # not listed on packaging Exp: not listed on packaging  Celebrate Vitamins Calcium Citrate Lot # not listed on packaging Exp: not listed on packaging  [place] Lot # [id number] Exp: 02/28/18  Premier Protein Clear Lot # [id number] Exp: 09/22/2018  The following the learning objectives were met by the patient during this course:  Identify Pre-Op Dietary Goals and will begin 2 weeks pre-operatively  Identify appropriate sources of fluids and proteins   State protein recommendations and appropriate sources pre and post-operatively  Identify Post-Operative Dietary Goals and will follow for 2 weeks post-operatively  Identify appropriate multivitamin and calcium sources  Describe the need for physical activity post-operatively and will follow MD recommendations  State when to call healthcare provider regarding medication questions or post-operative complications  Handouts given during class include:  Pre-Op Bariatric Surgery Diet Handout  Protein Shake Handout  Post-Op Bariatric Surgery Nutrition Handout  BELT Program Information Flyer  Support Group Information Flyer  [facility] Bariatric Supplements Price List  Follow-Up Plan: Patient will follow-up at [facility] 2 weeks post operatively for diet advancement per MD.

## 2018-01-27 ENCOUNTER — Ambulatory Visit
Admission: RE | Admit: 2018-01-27 | Discharge: 2018-01-27 | Disposition: A | Discharge status: Home / Self Care | Payer: BLUE CROSS/BLUE SHIELD | Source: Ambulatory Visit | Attending: Surgery | Admitting: Surgery

## 2018-01-27 DIAGNOSIS — Z01810 Encounter for preprocedural cardiovascular examination: Secondary | ICD-10-CM | POA: Insufficient documentation

## 2018-01-27 DIAGNOSIS — K219 Gastro-esophageal reflux disease without esophagitis: Secondary | ICD-10-CM | POA: Diagnosis not present

## 2018-01-27 DIAGNOSIS — Z01818 Encounter for other preprocedural examination: Secondary | ICD-10-CM | POA: Diagnosis not present

## 2018-03-17 ENCOUNTER — Encounter (HOSPITAL_COMMUNITY): Payer: Self-pay | Admitting: *Deleted

## 2018-04-05 NOTE — Patient Instructions (Addendum)
[name]  04/05/2018   Your procedure is scheduled on:  04-12-2018  Report to [facility] Main  Entrance  Report to admitting at   5:30 AM    Call this number if you have problems the morning of surgery [phone]   Remember: Do not eat food or drink liquids :After Midnight.     Take these medicines the morning of surgery with A SIP OF WATER:   NONE                                You may not have any metal on your body including hair pins and              piercings  Do not wear jewelry, make-up, lotions, powders or perfumes, deodorant             Do not wear nail polish.  Do not shave  48 hours prior to surgery.               Do not bring valuables to the hospital. [place].  Contacts, dentures or bridgework may not be worn into surgery.  Leave suitcase in the car. After surgery it may be brought to your room.    Special Instructions: N/A              Please read over the following fact sheets you were given: _____________________________________________________________________             [facility] - Preparing for Surgery Before surgery, you can play an important role.  Because skin is not sterile, your skin needs to be as free of germs as possible.  You can reduce the number of germs on your skin by washing with CHG (chlorahexidine gluconate) soap before surgery.  CHG is an antiseptic cleaner which kills germs and bonds with the skin to continue killing germs even after washing. Please DO NOT use if you have an allergy to CHG or antibacterial soaps.  If your skin becomes reddened/irritated stop using the CHG and inform your nurse when you arrive at Short Stay. Do not shave (including legs and underarms) for at least 48 hours prior to the first CHG shower.  You may shave your face/neck. Please follow these instructions carefully:  1.  Shower with CHG Soap the night before surgery and the   morning of Surgery.  2.  If you choose to wash your hair, wash your hair first as usual with your  normal  shampoo.  3.  After you shampoo, rinse your hair and body thoroughly to remove the  shampoo.                                        4.  Use CHG as you would any other liquid soap.  You can apply chg directly  to the skin and wash                       Gently with a scrungie or clean washcloth.  5.  Apply the CHG Soap to your body ONLY FROM THE NECK DOWN.   Do not  use on face/ open                           Wound or open sores. Avoid contact with eyes, ears mouth and genitals (private parts).                       Wash face,  Genitals (private parts) with your normal soap.             6.  Wash thoroughly, paying special attention to the area where your surgery  will be performed.  7.  Thoroughly rinse your body with warm water from the neck down.  8.  DO NOT shower/wash with your normal soap after using and rinsing off  the CHG Soap.             9.  Pat yourself dry with a clean towel.            10.  Wear clean pajamas.            11.  Place clean sheets on your bed the night of your first shower and do not  sleep with pets. Day of Surgery : Do not apply any lotions/deodorants the morning of surgery.  Please wear clean clothes to the hospital/surgery center.  FAILURE TO FOLLOW THESE INSTRUCTIONS MAY RESULT IN THE CANCELLATION OF YOUR SURGERY PATIENT SIGNATURE_________________________________  NURSE SIGNATURE__________________________________  ________________________________________________________________________

## 2018-04-06 ENCOUNTER — Encounter (HOSPITAL_COMMUNITY): Payer: Self-pay | Admitting: *Deleted

## 2018-04-06 ENCOUNTER — Other Ambulatory Visit: Payer: Self-pay

## 2018-04-06 ENCOUNTER — Encounter (HOSPITAL_COMMUNITY)
Admission: RE | Admit: 2018-04-06 | Discharge: 2018-04-06 | Disposition: A | Discharge status: Home / Self Care | Payer: BLUE CROSS/BLUE SHIELD | Source: Ambulatory Visit | Attending: Surgery | Admitting: Surgery

## 2018-04-06 ENCOUNTER — Encounter (HOSPITAL_COMMUNITY): Payer: Self-pay

## 2018-04-06 DIAGNOSIS — Z01812 Encounter for preprocedural laboratory examination: Secondary | ICD-10-CM | POA: Diagnosis present

## 2018-04-06 HISTORY — DX: Headache: R51

## 2018-04-06 HISTORY — DX: Presence of spectacles and contact lenses: Z97.3

## 2018-04-06 HISTORY — DX: Presence of dental prosthetic device (complete) (partial): Z97.2

## 2018-04-06 HISTORY — DX: Headache, unspecified: R51.9

## 2018-04-06 LAB — COMPREHENSIVE METABOLIC PANEL
ALT: 15 U/L (ref 0–44)
AST: 14 U/L — ABNORMAL LOW (ref 15–41)
Albumin: 3.5 g/dL (ref 3.5–5.0)
Alkaline Phosphatase: 84 U/L (ref 38–126)
Anion gap: 6 (ref 5–15)
BUN: 16 mg/dL (ref 6–20)
CALCIUM: 9 mg/dL (ref 8.9–10.3)
CO2: 26 mmol/L (ref 22–32)
CREATININE: 0.63 mg/dL (ref 0.44–1.00)
Chloride: 109 mmol/L (ref 98–111)
Glucose, Bld: 104 mg/dL — ABNORMAL HIGH (ref 70–99)
Potassium: 3.9 mmol/L (ref 3.5–5.1)
Sodium: 141 mmol/L (ref 135–145)
TOTAL PROTEIN: 8.2 g/dL — AB (ref 6.5–8.1)
Total Bilirubin: 0.7 mg/dL (ref 0.3–1.2)

## 2018-04-06 LAB — CBC
HCT: 37.9 % (ref 36.0–46.0)
Hemoglobin: 12.5 g/dL (ref 12.0–15.0)
MCH: 25.2 pg — ABNORMAL LOW (ref 26.0–34.0)
MCHC: 33 g/dL (ref 30.0–36.0)
MCV: 76.3 fL — ABNORMAL LOW (ref 78.0–100.0)
PLATELETS: 328 10*3/uL (ref 150–400)
RBC: 4.97 MIL/uL (ref 3.87–5.11)
RDW: 16.7 % — ABNORMAL HIGH (ref 11.5–15.5)
WBC: 5.6 10*3/uL (ref 4.0–10.5)

## 2018-04-06 NOTE — Progress Notes (Addendum)
NO PRE-OP ORDERS IN Epic AT TIME OF PAT APPOINTMENT TODAY 0800. EKG and CXR results dated 01-27-2018 in epic.

## 2018-04-11 NOTE — H&P (Signed)
Chief Complaint:  Morbid obesity with BMI 59  History of Present Illness:  [name] is an 47 y.o. female who wants sleeve gastrectomy for her BMI of 59.  She has GERD and we will see if there is a sliding hiatal hernia to repair.  She is aware of the risk of sleeve making this worse.  She is ready for sleeve gastrectomy.  Past Medical History:  Diagnosis Date  . Generalized headaches   . GERD (gastroesophageal reflux disease)   . History of Helicobacter pylori infection 2017   RESOLVED W/ TREATMENT  . Hypertension   . Morbid obesity ([place])   . Uterine leiomyoma   . Wears glasses   . Wears partial dentures    upper only    Past Surgical History:  Procedure Laterality Date  . NO PAST SURGERIES      No current facility-administered medications for this encounter.    Current Outpatient Medications  Medication Sig Dispense Refill  . lisinopril (PRINIVIL,ZESTRIL) 5 MG tablet Take 5 mg by mouth daily.    Marland Kitchen LUPRON DEPOT, 37-MONTH, 11.25 MG injection Inject into the muscle every 3 (three) months.   3  . Multiple Vitamin (MULTIVITAMIN) tablet Take 1 tablet by mouth daily. BARIATRIC MULTIVITAMIN    . naproxen sodium (ALEVE) 220 MG tablet Take 220 mg by mouth 2 (two) times daily as needed.    . pantoprazole (PROTONIX) 40 MG tablet Take 40 mg by mouth every evening.      Patient has no known allergies. Family History  Problem Relation Age of Onset  . Coronary artery disease Unknown        father side of family  . Stroke Mother   . Coronary artery disease Father   . Hypertension Other    Social History:   reports that she quit smoking about 3 years ago. Her smoking use included cigarettes. She quit after 20.00 years of use. She has never used smokeless tobacco. She reports that she drinks alcohol. She reports that she does not use drugs.   REVIEW OF SYSTEMS : Negative except for see problem list  Physical Exam:   There were no vitals taken for this visit. There is no height  or weight on file to calculate BMI.  Gen:  WDWN AAF NAD  Neurological: Alert and oriented to person, place, and time. Motor and sensory function is grossly intact  Head: Normocephalic and atraumatic.  Eyes: Conjunctivae are normal. Pupils are equal, round, and reactive to light. No scleral icterus.  Neck: Normal range of motion. Neck supple. No tracheal deviation or thyromegaly present.  Cardiovascular:  SR without murmurs or gallops.  No carotid bruits Breast:  Not examined Respiratory: Effort normal.  No respiratory distress. No chest wall tenderness. Breath sounds normal.  No wheezes, rales or rhonchi.  Abdomen:  Nontender; obese GU:  Not examined Musculoskeletal: Normal range of motion. Extremities are nontender. No cyanosis, edema or clubbing noted Lymphadenopathy: No cervical, preauricular, postauricular or axillary adenopathy is present Skin: Skin is warm and dry. No rash noted. No diaphoresis. No erythema. No pallor. Pscyh: Normal mood and affect. Behavior is normal. Judgment and thought content normal.   LABORATORY RESULTS: No results found for this or any previous visit (from the past 48 hour(s)).   RADIOLOGY RESULTS: No results found.  Problem List: Patient Active Problem List   Diagnosis Date Noted  . Chest pain 04/27/2011  . Obesity 04/27/2011  . OTITIS MEDIA, ACUTE 02/04/2010  . OTHER ACUTE SINUSITIS  02/04/2010    Assessment & Plan: Morbid obesity with BMI ~59 and GER--for sleeve gastrectomy and possible hiatal hernia repair.      Matt B. Hassell Done, MD, [facility] [phone] beeper [phone]  04/11/2018 2:10 PM

## 2018-04-11 NOTE — Anesthesia Preprocedure Evaluation (Addendum)
Anesthesia Evaluation  Patient identified by MRN, date of birth, ID band Patient awake    Reviewed: Allergy & Precautions, NPO status , Patient's Chart, lab work & pertinent test results  Airway Mallampati: II  TM Distance: >3 FB Neck ROM: Full    Dental  (+) Dental Advisory Given   Pulmonary former smoker,    breath sounds clear to auscultation       Cardiovascular hypertension, Pt. on medications  Rhythm:Regular Rate:Normal     Neuro/Psych negative neurological ROS     GI/Hepatic Neg liver ROS, GERD  ,  Endo/Other  Morbid obesity  Renal/GU negative Renal ROS     Musculoskeletal   Abdominal   Peds  Hematology negative hematology ROS (+)   Anesthesia Other Findings   Reproductive/Obstetrics                            Lab Results  Component Value Date   WBC 5.6 04/06/2018   HGB 12.5 04/06/2018   HCT 37.9 04/06/2018   MCV 76.3 (L) 04/06/2018   PLT 328 04/06/2018   Lab Results  Component Value Date   CREATININE 0.63 04/06/2018   BUN 16 04/06/2018   NA 141 04/06/2018   K 3.9 04/06/2018   CL 109 04/06/2018   CO2 26 04/06/2018    Anesthesia Physical Anesthesia Plan  ASA: III  Anesthesia Plan: General   Post-op Pain Management:    Induction: Intravenous  PONV Risk Score and Plan: 4 or greater and Scopolamine patch - Pre-op, Dexamethasone, Ondansetron, Midazolam and Treatment may vary due to age or medical condition  Airway Management Planned: Oral ETT and Video Laryngoscope Planned  Additional Equipment:   Intra-op Plan:   Post-operative Plan: Extubation in OR  Informed Consent: I have reviewed the patients History and Physical, chart, labs and discussed the procedure including the risks, benefits and alternatives for the proposed anesthesia with the patient or authorized representative who has indicated his/her understanding and acceptance.   Dental advisory  given  Plan Discussed with: CRNA  Anesthesia Plan Comments:        Anesthesia Quick Evaluation

## 2018-04-12 ENCOUNTER — Inpatient Hospital Stay (HOSPITAL_COMMUNITY): Payer: BLUE CROSS/BLUE SHIELD | Admitting: Certified Registered Nurse Anesthetist

## 2018-04-12 ENCOUNTER — Inpatient Hospital Stay (HOSPITAL_COMMUNITY)
Admission: RE | Admit: 2018-04-12 | Discharge: 2018-04-14 | DRG: 621 | Disposition: A | Discharge status: Home / Self Care | Payer: BLUE CROSS/BLUE SHIELD | Attending: Surgery | Admitting: Surgery

## 2018-04-12 ENCOUNTER — Encounter (HOSPITAL_COMMUNITY)
Admission: RE | Disposition: A | Discharge status: Home / Self Care | Payer: Self-pay | Source: Home / Self Care | Attending: Surgery

## 2018-04-12 ENCOUNTER — Other Ambulatory Visit: Payer: Self-pay

## 2018-04-12 ENCOUNTER — Encounter (HOSPITAL_COMMUNITY): Payer: Self-pay

## 2018-04-12 DIAGNOSIS — Z87891 Personal history of nicotine dependence: Secondary | ICD-10-CM

## 2018-04-12 DIAGNOSIS — K219 Gastro-esophageal reflux disease without esophagitis: Secondary | ICD-10-CM | POA: Diagnosis present

## 2018-04-12 DIAGNOSIS — Z6841 Body Mass Index (BMI) 40.0 and over, adult: Secondary | ICD-10-CM | POA: Diagnosis not present

## 2018-04-12 DIAGNOSIS — Z9884 Bariatric surgery status: Secondary | ICD-10-CM

## 2018-04-12 DIAGNOSIS — I10 Essential (primary) hypertension: Secondary | ICD-10-CM | POA: Diagnosis present

## 2018-04-12 DIAGNOSIS — Z8619 Personal history of other infectious and parasitic diseases: Secondary | ICD-10-CM

## 2018-04-12 DIAGNOSIS — Z79899 Other long term (current) drug therapy: Secondary | ICD-10-CM | POA: Diagnosis not present

## 2018-04-12 DIAGNOSIS — Z8249 Family history of ischemic heart disease and other diseases of the circulatory system: Secondary | ICD-10-CM

## 2018-04-12 HISTORY — DX: Leiomyoma of uterus, unspecified: D25.9

## 2018-04-12 HISTORY — DX: Morbid (severe) obesity due to excess calories: E66.01

## 2018-04-12 HISTORY — DX: Personal history of other infectious and parasitic diseases: Z86.19

## 2018-04-12 HISTORY — PX: LAPAROSCOPIC GASTRIC SLEEVE RESECTION: SHX5895

## 2018-04-12 LAB — CBC
HCT: 38 % (ref 36.0–46.0)
HEMOGLOBIN: 12.6 g/dL (ref 12.0–15.0)
MCH: 25.4 pg — ABNORMAL LOW (ref 26.0–34.0)
MCHC: 33.2 g/dL (ref 30.0–36.0)
MCV: 76.5 fL — AB (ref 78.0–100.0)
Platelets: 279 10*3/uL (ref 150–400)
RBC: 4.97 MIL/uL (ref 3.87–5.11)
RDW: 17.2 % — ABNORMAL HIGH (ref 11.5–15.5)
WBC: 9 10*3/uL (ref 4.0–10.5)

## 2018-04-12 LAB — ABO/RH: ABO/RH(D): O POS

## 2018-04-12 LAB — CREATININE, SERUM
Creatinine, Ser: 0.82 mg/dL (ref 0.44–1.00)
GFR calc Af Amer: >60 mL/min (ref >60)
GFR calc non Af Amer: >60 mL/min (ref >60)

## 2018-04-12 LAB — PREGNANCY, URINE: PREG TEST UR: NEGATIVE

## 2018-04-12 LAB — TYPE AND SCREEN
ABO/RH(D): O POS
Antibody Screen: NEGATIVE

## 2018-04-12 SURGERY — GASTRECTOMY, SLEEVE, LAPAROSCOPIC
Anesthesia: General

## 2018-04-12 MED ORDER — DEXAMETHASONE SODIUM PHOSPHATE 10 MG/ML IJ SOLN
INTRAMUSCULAR | Status: AC
  Filled 2018-04-12: qty 1

## 2018-04-12 MED ORDER — SODIUM CHLORIDE 0.9 % IV SOLN
2.0000 g | INTRAVENOUS | Status: AC
  Administered 2018-04-12: 2 g via INTRAVENOUS
  Filled 2018-04-12: qty 2

## 2018-04-12 MED ORDER — 0.9 % SODIUM CHLORIDE (POUR BTL) OPTIME
TOPICAL | Status: DC | PRN
  Administered 2018-04-12: 1000 mL

## 2018-04-12 MED ORDER — FENTANYL CITRATE (PF) 100 MCG/2ML IJ SOLN
INTRAMUSCULAR | Status: DC | PRN
  Administered 2018-04-12: 50 ug via INTRAVENOUS

## 2018-04-12 MED ORDER — ONDANSETRON HCL 4 MG/2ML IJ SOLN: 4.0000 mg | INTRAMUSCULAR | Status: DC | PRN

## 2018-04-12 MED ORDER — KCL IN DEXTROSE-NACL 20-5-0.45 MEQ/L-%-% IV SOLN
INTRAVENOUS | Status: DC
  Administered 2018-04-12: 12:00:00 via INTRAVENOUS
  Administered 2018-04-12 – 2018-04-13 (×2): 1000 mL via INTRAVENOUS
  Administered 2018-04-14: 03:00:00 via INTRAVENOUS
  Filled 2018-04-12 (×5): qty 1000

## 2018-04-12 MED ORDER — PHENYLEPHRINE 40 MCG/ML (10ML) SYRINGE FOR IV PUSH (FOR BLOOD PRESSURE SUPPORT)
PREFILLED_SYRINGE | INTRAVENOUS | Status: AC
  Filled 2018-04-12: qty 10

## 2018-04-12 MED ORDER — LIDOCAINE 2% (20 MG/ML) 5 ML SYRINGE
INTRAMUSCULAR | Status: AC
  Filled 2018-04-12: qty 5

## 2018-04-12 MED ORDER — FENTANYL CITRATE (PF) 250 MCG/5ML IJ SOLN
INTRAMUSCULAR | Status: AC
  Filled 2018-04-12: qty 5

## 2018-04-12 MED ORDER — BUPIVACAINE LIPOSOME 1.3 % IJ SUSP
INTRAMUSCULAR | Status: DC | PRN
  Administered 2018-04-12: 20 mL

## 2018-04-12 MED ORDER — GABAPENTIN 300 MG PO CAPS
300.0000 mg | ORAL_CAPSULE | ORAL | Status: AC
  Administered 2018-04-12: 300 mg via ORAL
  Filled 2018-04-12: qty 1

## 2018-04-12 MED ORDER — LACTATED RINGERS IR SOLN
Status: DC | PRN
  Administered 2018-04-12: 1000 mL

## 2018-04-12 MED ORDER — MORPHINE SULFATE (PF) 2 MG/ML IV SOLN: 1.0000 mg | INTRAVENOUS | Status: DC | PRN

## 2018-04-12 MED ORDER — SODIUM CHLORIDE 0.9 % IJ SOLN
INTRAMUSCULAR | Status: AC
  Filled 2018-04-12: qty 10

## 2018-04-12 MED ORDER — HEPARIN SODIUM (PORCINE) 5000 UNIT/ML IJ SOLN
5000.0000 [IU] | INTRAMUSCULAR | Status: AC
  Administered 2018-04-12: 5000 [IU] via SUBCUTANEOUS
  Filled 2018-04-12: qty 1

## 2018-04-12 MED ORDER — OXYCODONE HCL 5 MG/5ML PO SOLN: 5.0000 mg | ORAL | Status: DC | PRN

## 2018-04-12 MED ORDER — SODIUM CHLORIDE 0.9 % IJ SOLN
INTRAMUSCULAR | Status: DC | PRN
  Administered 2018-04-12: 10 mL

## 2018-04-12 MED ORDER — SUGAMMADEX SODIUM 500 MG/5ML IV SOLN
INTRAVENOUS | Status: DC | PRN
  Administered 2018-04-12: 400 mg via INTRAVENOUS

## 2018-04-12 MED ORDER — HEPARIN SODIUM (PORCINE) 5000 UNIT/ML IJ SOLN
5000.0000 [IU] | Freq: Three times a day (TID) | INTRAMUSCULAR | Status: DC
  Administered 2018-04-12 – 2018-04-14 (×6): 5000 [IU] via SUBCUTANEOUS
  Filled 2018-04-12 (×5): qty 1

## 2018-04-12 MED ORDER — APREPITANT 40 MG PO CAPS
40.0000 mg | ORAL_CAPSULE | ORAL | Status: AC
  Administered 2018-04-12: 40 mg via ORAL
  Filled 2018-04-12: qty 1

## 2018-04-12 MED ORDER — DEXAMETHASONE SODIUM PHOSPHATE 10 MG/ML IJ SOLN
INTRAMUSCULAR | Status: DC | PRN
  Administered 2018-04-12: 5 mg via INTRAVENOUS

## 2018-04-12 MED ORDER — ONDANSETRON HCL 4 MG/2ML IJ SOLN
INTRAMUSCULAR | Status: AC
  Filled 2018-04-12: qty 2

## 2018-04-12 MED ORDER — GABAPENTIN 100 MG PO CAPS
200.0000 mg | ORAL_CAPSULE | Freq: Two times a day (BID) | ORAL | Status: DC
  Administered 2018-04-12 – 2018-04-13 (×2): 200 mg via ORAL
  Filled 2018-04-12 (×2): qty 2

## 2018-04-12 MED ORDER — KETAMINE HCL 10 MG/ML IJ SOLN
INTRAMUSCULAR | Status: DC | PRN
  Administered 2018-04-12: 25 mg via INTRAVENOUS

## 2018-04-12 MED ORDER — PHENYLEPHRINE 40 MCG/ML (10ML) SYRINGE FOR IV PUSH (FOR BLOOD PRESSURE SUPPORT)
PREFILLED_SYRINGE | INTRAVENOUS | Status: DC | PRN
  Administered 2018-04-12 (×3): 80 ug via INTRAVENOUS
  Administered 2018-04-12: 40 ug via INTRAVENOUS
  Administered 2018-04-12 (×3): 80 ug via INTRAVENOUS

## 2018-04-12 MED ORDER — HYDRALAZINE HCL 20 MG/ML IJ SOLN
10.0000 mg | INTRAMUSCULAR | Status: DC | PRN
  Administered 2018-04-12: 10 mg via INTRAVENOUS
  Filled 2018-04-12 (×2): qty 1

## 2018-04-12 MED ORDER — EPHEDRINE 5 MG/ML INJ
INTRAVENOUS | Status: AC
  Filled 2018-04-12: qty 10

## 2018-04-12 MED ORDER — MIDAZOLAM HCL 2 MG/2ML IJ SOLN
INTRAMUSCULAR | Status: AC
  Filled 2018-04-12: qty 2

## 2018-04-12 MED ORDER — PHENYLEPHRINE HCL 10 MG/ML IJ SOLN
INTRAVENOUS | Status: DC | PRN
  Administered 2018-04-12: 25 ug/min via INTRAVENOUS

## 2018-04-12 MED ORDER — ONDANSETRON HCL 4 MG/2ML IJ SOLN: 4.0000 mg | Freq: Once | INTRAMUSCULAR | Status: DC | PRN

## 2018-04-12 MED ORDER — ACETAMINOPHEN 500 MG PO TABS
1000.0000 mg | ORAL_TABLET | ORAL | Status: AC
  Administered 2018-04-12: 1000 mg via ORAL
  Filled 2018-04-12: qty 2

## 2018-04-12 MED ORDER — SCOPOLAMINE 1 MG/3DAYS TD PT72
MEDICATED_PATCH | TRANSDERMAL | Status: AC
  Filled 2018-04-12: qty 1

## 2018-04-12 MED ORDER — SUCCINYLCHOLINE CHLORIDE 200 MG/10ML IV SOSY
PREFILLED_SYRINGE | INTRAVENOUS | Status: DC | PRN
  Administered 2018-04-12: 160 mg via INTRAVENOUS

## 2018-04-12 MED ORDER — CELECOXIB 200 MG PO CAPS
400.0000 mg | ORAL_CAPSULE | ORAL | Status: AC
  Administered 2018-04-12: 400 mg via ORAL
  Filled 2018-04-12: qty 2

## 2018-04-12 MED ORDER — CHLORHEXIDINE GLUCONATE CLOTH 2 % EX PADS: 6.0000 | MEDICATED_PAD | Freq: Once | CUTANEOUS | Status: DC

## 2018-04-12 MED ORDER — FENTANYL CITRATE (PF) 100 MCG/2ML IJ SOLN
INTRAMUSCULAR | Status: AC
  Filled 2018-04-12: qty 2

## 2018-04-12 MED ORDER — FENTANYL CITRATE (PF) 100 MCG/2ML IJ SOLN
25.0000 ug | INTRAMUSCULAR | Status: DC | PRN
  Administered 2018-04-12 (×2): 25 ug via INTRAVENOUS

## 2018-04-12 MED ORDER — LACTATED RINGERS IV SOLN
INTRAVENOUS | Status: DC | PRN
  Administered 2018-04-12 (×2): via INTRAVENOUS

## 2018-04-12 MED ORDER — SCOPOLAMINE 1 MG/3DAYS TD PT72
1.0000 | MEDICATED_PATCH | TRANSDERMAL | Status: DC
  Administered 2018-04-12: 1.5 mg via TRANSDERMAL

## 2018-04-12 MED ORDER — PROPOFOL 10 MG/ML IV BOLUS
INTRAVENOUS | Status: AC
Start: 2018-04-12 — End: ?
  Filled 2018-04-12: qty 40

## 2018-04-12 MED ORDER — BUPIVACAINE LIPOSOME 1.3 % IJ SUSP
20.0000 mL | Freq: Once | INTRAMUSCULAR | Status: DC
  Filled 2018-04-12: qty 20

## 2018-04-12 MED ORDER — FAMOTIDINE IN NACL 20-0.9 MG/50ML-% IV SOLN
20.0000 mg | Freq: Two times a day (BID) | INTRAVENOUS | Status: DC
  Administered 2018-04-12 – 2018-04-14 (×5): 20 mg via INTRAVENOUS
  Filled 2018-04-12 (×5): qty 50

## 2018-04-12 MED ORDER — PROPOFOL 10 MG/ML IV BOLUS
INTRAVENOUS | Status: DC | PRN
  Administered 2018-04-12: 325 mg via INTRAVENOUS

## 2018-04-12 MED ORDER — MIDAZOLAM HCL 5 MG/5ML IJ SOLN
INTRAMUSCULAR | Status: DC | PRN
  Administered 2018-04-12: 2 mg via INTRAVENOUS

## 2018-04-12 MED ORDER — KETAMINE HCL 10 MG/ML IJ SOLN
INTRAMUSCULAR | Status: AC
  Filled 2018-04-12: qty 1

## 2018-04-12 MED ORDER — PREMIER PROTEIN SHAKE
2.0000 [oz_av] | ORAL | Status: DC
  Administered 2018-04-13 – 2018-04-14 (×8): 2 [oz_av] via ORAL

## 2018-04-12 MED ORDER — DEXAMETHASONE SODIUM PHOSPHATE 4 MG/ML IJ SOLN: 4.0000 mg | INTRAMUSCULAR | Status: DC

## 2018-04-12 MED ORDER — ACETAMINOPHEN 160 MG/5ML PO SOLN
650.0000 mg | Freq: Four times a day (QID) | ORAL | Status: DC
  Administered 2018-04-12 – 2018-04-14 (×6): 650 mg via ORAL
  Filled 2018-04-12 (×7): qty 20.3

## 2018-04-12 MED ORDER — ONDANSETRON HCL 4 MG/2ML IJ SOLN
INTRAMUSCULAR | Status: DC | PRN
  Administered 2018-04-12: 4 mg via INTRAVENOUS

## 2018-04-12 MED ORDER — ROCURONIUM BROMIDE 50 MG/5ML IV SOSY
PREFILLED_SYRINGE | INTRAVENOUS | Status: DC | PRN
  Administered 2018-04-12: 20 mg via INTRAVENOUS
  Administered 2018-04-12: 60 mg via INTRAVENOUS
  Administered 2018-04-12 (×3): 20 mg via INTRAVENOUS

## 2018-04-12 MED ORDER — LIDOCAINE 2% (20 MG/ML) 5 ML SYRINGE
INTRAMUSCULAR | Status: DC | PRN
  Administered 2018-04-12: 1.5 mg/kg/h via INTRAVENOUS

## 2018-04-12 SURGICAL SUPPLY — 61 items
APPLICATOR COTTON TIP 6IN STRL (MISCELLANEOUS) ×2 IMPLANT
APPLIER CLIP 5 13 M/L LIGAMAX5 (MISCELLANEOUS)
APPLIER CLIP ROT 10 11.4 M/L (STAPLE)
APPLIER CLIP ROT 13.4 12 LRG (CLIP)
BLADE SURG 15 STRL LF DISP TIS (BLADE) ×1 IMPLANT
BLADE SURG 15 STRL SS (BLADE) ×1
CABLE HIGH FREQUENCY MONO STRZ (ELECTRODE) ×2 IMPLANT
CLIP APPLIE 5 13 M/L LIGAMAX5 (MISCELLANEOUS) IMPLANT
CLIP APPLIE ROT 10 11.4 M/L (STAPLE) IMPLANT
CLIP APPLIE ROT 13.4 12 LRG (CLIP) IMPLANT
DERMABOND ADVANCED (GAUZE/BANDAGES/DRESSINGS) ×1
DERMABOND ADVANCED .7 DNX12 (GAUZE/BANDAGES/DRESSINGS) ×1 IMPLANT
DEVICE SUT QUICK LOAD TK 5 (STAPLE) IMPLANT
DEVICE SUT TI-KNOT TK 5X26 (MISCELLANEOUS) IMPLANT
DEVICE SUTURE ENDOST 10MM (ENDOMECHANICALS) IMPLANT
DISSECTOR BLUNT TIP ENDO 5MM (MISCELLANEOUS) IMPLANT
ELECT REM PT RETURN 15FT ADLT (MISCELLANEOUS) ×2 IMPLANT
GAUZE SPONGE 4X4 12PLY STRL (GAUZE/BANDAGES/DRESSINGS) IMPLANT
GLOVE BIO SURGEON STRL SZ7.5 (GLOVE) ×2 IMPLANT
GLOVE BIOGEL M 8.0 STRL (GLOVE) ×2 IMPLANT
GLOVE BIOGEL PI IND STRL 6.5 (GLOVE) ×2 IMPLANT
GLOVE BIOGEL PI IND STRL 7.0 (GLOVE) ×2 IMPLANT
GLOVE BIOGEL PI INDICATOR 6.5 (GLOVE) ×2
GLOVE BIOGEL PI INDICATOR 7.0 (GLOVE) ×2
GLOVE SURG SS PI 7.0 STRL IVOR (GLOVE) ×2 IMPLANT
GOWN STRL REUS W/TWL XL LVL3 (GOWN DISPOSABLE) ×10 IMPLANT
GRASPER SUT TROCAR 14GX15 (MISCELLANEOUS) ×2 IMPLANT
HANDLE STAPLE EGIA 4 XL (STAPLE) ×2 IMPLANT
HOVERMATT SINGLE USE (MISCELLANEOUS) ×2 IMPLANT
KIT BASIN OR (CUSTOM PROCEDURE TRAY) ×2 IMPLANT
MARKER SKIN DUAL TIP RULER LAB (MISCELLANEOUS) ×2 IMPLANT
NEEDLE SPNL 22GX3.5 QUINCKE BK (NEEDLE) ×2 IMPLANT
PACK UNIVERSAL I (CUSTOM PROCEDURE TRAY) ×2 IMPLANT
RELOAD TRI 45 ART MED THCK BLK (STAPLE) ×2 IMPLANT
RELOAD TRI 45 ART MED THCK PUR (STAPLE) IMPLANT
RELOAD TRI 60 ART MED THCK BLK (STAPLE) ×2 IMPLANT
RELOAD TRI 60 ART MED THCK PUR (STAPLE) ×6 IMPLANT
SCISSORS LAP 5X45 EPIX DISP (ENDOMECHANICALS) IMPLANT
SET IRRIG TUBING LAPAROSCOPIC (IRRIGATION / IRRIGATOR) ×2 IMPLANT
SHEARS HARMONIC ACE PLUS 45CM (MISCELLANEOUS) ×2 IMPLANT
SLEEVE ADV FIXATION 5X100MM (TROCAR) ×4 IMPLANT
SLEEVE GASTRECTOMY 36FR VISIGI (MISCELLANEOUS) ×2 IMPLANT
SOLUTION ANTI FOG 6CC (MISCELLANEOUS) ×2 IMPLANT
SPONGE LAP 18X18 RF (DISPOSABLE) ×2 IMPLANT
STAPLER VISISTAT 35W (STAPLE) ×2 IMPLANT
SUT MNCRL AB 4-0 PS2 18 (SUTURE) ×6 IMPLANT
SUT SURGIDAC NAB ES-9 0 48 120 (SUTURE) IMPLANT
SUT VIC AB 4-0 SH 18 (SUTURE) IMPLANT
SUT VICRYL 0 TIES 12 18 (SUTURE) ×2 IMPLANT
SYR 10ML ECCENTRIC (SYRINGE) ×2 IMPLANT
SYR 20CC LL (SYRINGE) ×2 IMPLANT
SYR 50ML LL SCALE MARK (SYRINGE) ×2 IMPLANT
TOWEL OR 17X26 10 PK STRL BLUE (TOWEL DISPOSABLE) ×4 IMPLANT
TOWEL OR NON WOVEN STRL DISP B (DISPOSABLE) ×2 IMPLANT
TROCAR ADV FIXATION 5X100MM (TROCAR) ×2 IMPLANT
TROCAR BLADELESS 15MM (ENDOMECHANICALS) ×2 IMPLANT
TROCAR BLADELESS OPT 5 100 (ENDOMECHANICALS) ×2 IMPLANT
TUBE CALIBRATION LAPBAND (TUBING) ×2 IMPLANT
TUBING CONNECTING 10 (TUBING) ×4 IMPLANT
TUBING ENDO SMARTCAP (MISCELLANEOUS) ×2 IMPLANT
TUBING INSUF HEATED (TUBING) ×2 IMPLANT

## 2018-04-12 NOTE — Op Note (Signed)
Surgeon: Kaylyn Lim, MD, FACS  Asst:  Greer Pickerel, MD, FACS  Anes:  General endotracheal  Procedure: Laparoscopic sleeve gastrectomy and upper endoscopy  Diagnosis: Morbid obesity  Complications: none  EBL:   minimal cc  Description of Procedure:  The patient was take to OR 4 and given general anesthesia.  The abdomen was prepped with Technicare and draped sterilely.  A timeout was performed.  Access to the abdomen was achieved with a 5 mm Optiview through the left upper quadrant.  Following insufflation, the state of the abdomen was found to be free of adhesions.  A balloon test was performed with 10 cc of air and was negative.   The ViSiGi 36Fr tube was inserted to deflate the stomach and was pulled back into the esophagus.    The pylorus was identified and we measured ~5 cm back and marked the antrum.  At that point we began dissection to take down the greater curvature of the stomach using the Harmonic scalpel.  This dissection was taken all the way up to the left crus.  Posterior attachments of the stomach were also taken down.    The ViSiGi tube was then passed into the antrum and suction applied so that it was snug along the lessor curvature.  The "crow's foot" or incisura was identified.  The sleeve gastrectomy was begun using the [employer] stapler beginning with a 4.5 cm black load with TRS followed by a 6 cm black load with TRS.  The sleeve was completed with multiple firings of purple loads with TRS.  When the sleeve was complete the tube was taken off suction and insufflated briefly.  The tube was withdrawn.  Upper endoscopy was then performed by Dr. [name].     The specimen was extracted through the 15 trocar site which was closed with a single 0 vicryl placed with the suture passer.  .  Wounds were infiltrated with Exparel TAP block diluted to 30 cc and closed with 4-0 Monocryl.[name] B. Hassell Done, [place], [facility], [place]

## 2018-04-12 NOTE — Interval H&P Note (Signed)
History and Physical Interval Note:  04/12/2018 7:10 AM  [name]  has presented today for surgery, with the diagnosis of MORBID OBESITY  The various methods of treatment have been discussed with the patient and family. After consideration of risks, benefits and other options for treatment, the patient has consented to  Procedure(s): LAPAROSCOPIC GASTRIC SLEEVE RESECTION WITH UPPER ENDO WITH AND ERAS PATHWAY (N/A) as a surgical intervention .  The patient's history has been reviewed, patient examined, no change in status, stable for surgery.  I have reviewed the patient's chart and labs.  Questions were answered to the patient's satisfaction.     Pedro Earls

## 2018-04-12 NOTE — Anesthesia Postprocedure Evaluation (Signed)
Anesthesia Post Note  Patient: [name]  Procedure(s) Performed: LAPAROSCOPIC GASTRIC SLEEVE RESECTION WITH UPPER ENDO AND ERAS PATHWAY (N/A )     Patient location during evaluation: PACU Anesthesia Type: General Level of consciousness: awake and alert Pain management: pain level controlled Vital Signs Assessment: post-procedure vital signs reviewed and stable Respiratory status: spontaneous breathing, nonlabored ventilation, respiratory function stable and patient connected to nasal cannula oxygen Cardiovascular status: blood pressure returned to baseline and stable Postop Assessment: no apparent nausea or vomiting Anesthetic complications: no    Last Vitals:  Vitals:   04/12/18 1030 04/12/18 1055  BP:  (!) 152/87  Pulse:  76  Resp:  16  Temp: 36.9 C 36.6 C  SpO2:  97%    Last Pain:  Vitals:   04/12/18 1055  TempSrc: Oral  PainSc:                  Tiajuana Amass

## 2018-04-12 NOTE — Anesthesia Procedure Notes (Signed)
Procedure Name: Intubation Date/Time: 04/12/2018 7:33 AM Performed by: West Pugh, CRNA Pre-anesthesia Checklist: Patient identified, Emergency Drugs available, Suction available, Patient being monitored and Timeout performed Patient Re-evaluated:Patient Re-evaluated prior to induction Oxygen Delivery Method: Circle system utilized Preoxygenation: Pre-oxygenation with 100% oxygen Induction Type: IV induction Laryngoscope Size: 3 and Glidescope Grade View: Grade I Tube type: Oral Tube size: 7.5 mm Number of attempts: 1 Airway Equipment and Method: Stylet and Video-laryngoscopy Placement Confirmation: positive ETCO2,  CO2 detector,  breath sounds checked- equal and bilateral and ETT inserted through vocal cords under direct vision Secured at: 22 cm Tube secured with: Tape Dental Injury: Teeth and Oropharynx as per pre-operative assessment  Comments: Elective use of video laryngoscopy. AOI x 1 attempt.

## 2018-04-12 NOTE — Transfer of Care (Signed)
Immediate Anesthesia Transfer of Care Note  Patient: [name]  Procedure(s) Performed: LAPAROSCOPIC GASTRIC SLEEVE RESECTION WITH UPPER ENDO AND ERAS PATHWAY (N/A )  Patient Location: PACU  Anesthesia Type:General  Level of Consciousness: oriented, drowsy and patient cooperative  Airway & Oxygen Therapy: Patient Spontanous Breathing and Patient connected to face mask oxygen  Post-op Assessment: Report given to RN and Post -op Vital signs reviewed and stable  Post vital signs: Reviewed and stable  Last Vitals:  Vitals Value Taken Time  BP 135/79 04/12/2018  9:45 AM  Temp 36.9 C 04/12/2018  9:45 AM  Pulse 81 04/12/2018  9:48 AM  Resp 25 04/12/2018  9:48 AM  SpO2 100 % 04/12/2018  9:48 AM  Vitals shown include unvalidated device data.  Last Pain:  Vitals:   04/12/18 0607  TempSrc: Oral         Complications: No apparent anesthesia complications

## 2018-04-12 NOTE — Discharge Instructions (Signed)
° ° ° °GASTRIC BYPASS/SLEEVE ° Home Care Instructions ° ° These instructions are to help you care for yourself when you go home. ° °Call: If you have any problems. °• Call [phone] and ask for the surgeon on call °• If you need immediate help, come to the ER at [facility].  °• Tell the ER staff that you are a new post-op gastric bypass or gastric sleeve patient °  °Signs and symptoms to report: • Severe vomiting or nausea °o If you cannot keep down clear liquids for longer than 1 day, call your surgeon  °• Abdominal pain that does not get better after taking your pain medication °• Fever over 100.4° F with chills °• Heart beating over 100 beats a minute °• Shortness of breath at rest °• Chest pain °•  Redness, swelling, drainage, or foul odor at incision (surgical) sites °•  If your incisions open or pull apart °• Swelling or pain in calf (lower leg) °• Diarrhea (Loose bowel movements that happen often), frequent watery, uncontrolled bowel movements °• Constipation, (no bowel movements for 3 days) if this happens: Pick one °o Milk of Magnesia, 2 tablespoons by mouth, 3 times a day for 2 days if needed °o Stop taking Milk of Magnesia once you have a bowel movement °o Call your doctor if constipation continues °Or °o Miralax  (instead of Milk of Magnesia) following the label instructions °o Stop taking Miralax once you have a bowel movement °o Call your doctor if constipation continues °• Anything you think is not normal °  °Normal side effects after surgery: • Unable to sleep at night or unable to focus °• Irritability or moody °• Being tearful (crying) or depressed °These are common complaints, possibly related to your anesthesia medications that put you to sleep, stress of surgery, and change in lifestyle.  This usually goes away a few weeks after surgery.  If these feelings continue, call your primary care doctor. °  °Wound Care: You may have surgical glue, steri-strips, or staples over your incisions after  surgery °• Surgical glue:  Looks like a clear film over your incisions and will wear off a little at a time °• Steri-strips: Strips of tape over your incisions. You may notice a yellowish color on the skin under the steri-strips. This is used to make the   steri-strips stick better. Do not pull the steri-strips off - let them fall off °• Staples: Staples may be removed before you leave the hospital °o If you go home with staples, call [facility], [phone] at for an appointment with your surgeon’s nurse to have staples removed 10 days after surgery. °• Showering: You may shower two (2) days after your surgery unless your surgeon tells you differently °o Wash gently around incisions with warm soapy water, rinse well, and gently pat dry  °o No tub baths until staples are removed, steri-strips fall off or glue is gone.  °  °Medications: • Medications should be liquid or crushed if larger than the size of a dime °• Extended release pills (medication that release a little bit at a time through the day) should NOT be crushed or cut. (examples include XL, ER, DR, SR) °• Depending on the size and number of medications you take, you may need to space (take a few throughout the day)/change the time you take your medications so that you do not over-fill your pouch (smaller stomach) °• Make sure you follow-up with your primary care doctor to   make medication changes needed during rapid weight loss and life-style changes °• If you have diabetes, follow up with the doctor that orders your diabetes medication(s) within one week after surgery and check your blood sugar regularly. °• Do not drive while taking prescription pain medication  °• It is ok to take Tylenol by the bottle instructions with your pain medicine or instead of your pain medicine as needed.  DO NOT TAKE NSAIDS (EXAMPLES OF NSAIDS:  IBUPROFREN/ NAPROXEN)  °Diet:                    First 2 Weeks ° You will see the dietician t about two (2) weeks  after your surgery. The dietician will increase the types of foods you can eat if you are handling liquids well: °• If you have severe vomiting or nausea and cannot keep down clear liquids lasting longer than 1 day, call your surgeon @ ([phone]) °Protein Shake °• Drink at least 2 ounces of shake 5-6 times per day °• Each serving of protein shakes (usually 8 - 12 ounces) should have: °o 15 grams of protein  °o And no more than 5 grams of carbohydrate  °• Goal for protein each day: °o Men = 80 grams per day °o Women = 60 grams per day °• Protein powder may be added to fluids such as non-fat milk or Lactaid milk or unsweetened Soy/Almond milk (limit to 35 grams added protein powder per serving) ° °Hydration °• Slowly increase the amount of water and other clear liquids as tolerated (See Acceptable Fluids) °• Slowly increase the amount of protein shake as tolerated  °•  Sip fluids slowly and throughout the day.  Do not use straws. °• May use sugar substitutes in small amounts (no more than 6 - 8 packets per day; i.e. Splenda) ° °Fluid Goal °• The first goal is to drink at least 8 ounces of protein shake/drink per day (or as directed by the nutritionist); some examples of protein shakes are Syntrax Nectar, Adkins Advantage, EAS Edge HP, and Unjury. See handout from pre-op Bariatric Education Class: °o Slowly increase the amount of protein shake you drink as tolerated °o You may find it easier to slowly sip shakes throughout the day °o It is important to get your proteins in first °• Your fluid goal is to drink 64 - 100 ounces of fluid daily °o It may take a few weeks to build up to this °• 32 oz (or more) should be clear liquids  °And  °• 32 oz (or more) should be full liquids (see below for examples) °• Liquids should not contain sugar, caffeine, or carbonation ° °Clear Liquids: °• Water or Sugar-free flavored water (i.e. Fruit H2O, Propel) °• Decaffeinated coffee or tea (sugar-free) °• Crystal Lite, Wyler’s Lite,  Minute Maid Lite °• Sugar-free Jell-O °• Bouillon or broth °• Sugar-free Popsicle:   *Less than 20 calories each; Limit 1 per day ° °Full Liquids: °Protein Shakes/Drinks + 2 choices per day of other full liquids °• Full liquids must be: °o No More Than 15 grams of Carbs per serving  °o No More Than 3 grams of Fat per serving °• Strained low-fat cream soup (except Cream of Potato or Tomato) °• Non-Fat milk °• Fat-free Lactaid Milk °• Unsweetened Soy Or Unsweetened Almond Milk °• Low Sugar yogurt (Dannon Lite & Fit, Greek yogurt; Oikos Triple Zero; Chobani Simply 100; Yoplait 100 calorie Greek - No Fruit on the Bottom) ° °  °Vitamins   and Minerals • Start 1 day after surgery unless otherwise directed by your surgeon °• 2 Chewable Bariatric Specific Multivitamin / Multimineral Supplement with iron (Example: Bariatric Advantage Multi EA) °• Chewable Calcium with Vitamin D-3 °(Example: 3 Chewable Calcium Plus 600 with Vitamin D-3) °o Take 500 mg three (3) times a day for a total of 1500 mg each day °o Do not take all 3 doses of calcium at one time as it may cause constipation, and you can only absorb 500 mg  at a time  °o Do not mix multivitamins containing iron with calcium supplements; take 2 hours apart °• Menstruating women and those with a history of anemia (a blood disease that causes weakness) may need extra iron °o Talk with your doctor to see if you need more iron °• Do not stop taking or change any vitamins or minerals until you talk to your dietitian or surgeon °• Your Dietitian and/or surgeon must approve all vitamin and mineral supplements °  °Activity and Exercise: Limit your physical activity as instructed by your doctor.  It is important to continue walking at home.  During this time, use these guidelines: °• Do not lift anything greater than ten (10) pounds for at least two (2) weeks °• Do not go back to work or drive until your surgeon says you can °• You may have sex when you feel comfortable  °o It is  VERY important for female patients to use a reliable birth control method; fertility often increases after surgery  °o All hormonal birth control will be ineffective for 30 days after surgery due to medications given during surgery a barrier method must be used. °o Do not get pregnant for at least 18 months °• Start exercising as soon as your doctor tells you that you can °o Make sure your doctor approves any physical activity °• Start with a simple walking program °• Walk 5-15 minutes each day, 7 days per week.  °• Slowly increase until you are walking 30-45 minutes per day °Consider joining our BELT program. [phone] or email [email or web address] °  °Special Instructions Things to remember: °• Use your CPAP when sleeping if this applies to you ° °• [facility] has two free Bariatric Surgery Support Groups that meet monthly °o The 3rd Thursday of each month, 6 pm, [facility]  °o The 2nd Friday of each month, 11:45 am in the private dining room in the basement of  °• It is very important to keep all follow up appointments with your surgeon, dietitian, primary care physician, and behavioral health practitioner °• Routine follow up schedule with your surgeon include appointments at 2-3 weeks, 6-8 weeks, 6 months, and 1 year at a minimum.  Your surgeon may request to see you more often.   °o After the first year, please follow up with your bariatric surgeon and dietitian at least once a year in order to maintain best weight loss results °[facility]: [phone] °[facility]: [phone] °Bariatric Nurse Coordinator: [phone] °  °   Reviewed and Endorsed  °by [facility] Patient Education Committee, June, 2016 °Edits Approved: Aug, 2018 ° ° ° °

## 2018-04-12 NOTE — Op Note (Signed)
[name] [id number] 10-06-1970 04/12/2018  Preoperative diagnosis: severe obesity  Postoperative diagnosis: Same   Procedure: upper endoscopy   Surgeon: Leighton Ruff. [name] M.D., FACS   Anesthesia: Gen.   Indications for procedure: 48 y.o. year old female undergoing Laparoscopic Gastric Sleeve Resection and an EGD was requested to evaluate the new gastric sleeve.   Description of procedure: After we have completed the sleeve resection, I scrubbed out and obtained the Olympus endoscope. I gently placed endoscope in the patient's oropharynx and gently glided it down the esophagus without any difficulty under direct visualization. Once I was in the gastric sleeve, I insufflated the stomach with air. I was able to cannulate and advanced the scope through the gastric sleeve. I was able to cannulate the duodenum with ease. Dr. Hassell Done had placed saline in the upper abdomen. Upon further insufflation of the gastric sleeve there was no evidence of bubbles. GE junction located at 42 cm.  Upon further inspection of the gastric sleeve, the mucosa appeared normal. There is no evidence of any mucosal abnormality. The sleeve was widely patent at the angularis. There was no evidence of bleeding. The gastric sleeve was decompressed. The scope was withdrawn. The patient tolerated this portion of the procedure well. Please see Dr [name] operative note for details regarding the laparoscopic gastric sleeve resection.   Leighton Ruff. [name], MD, FACS  General, Bariatric, & Minimally Invasive Surgery  [facility], [place]

## 2018-04-12 NOTE — Progress Notes (Signed)
Patient resting.  Family at bedside.  Patient has ambulated and sat in recliner since surgery.  Voiding, vital signs stable.  Discussed progression with bedside RN.  Patient left information on Phase I Diet along with Progression Guide.

## 2018-04-13 ENCOUNTER — Encounter (HOSPITAL_COMMUNITY): Payer: Self-pay | Admitting: Surgery

## 2018-04-13 LAB — CBC WITH DIFFERENTIAL/PLATELET
BASOS ABS: 0 10*3/uL (ref 0.0–0.1)
Basophils Relative: 0 %
EOS ABS: 0 10*3/uL (ref 0.0–0.7)
EOS PCT: 0 %
HCT: 35.9 % — ABNORMAL LOW (ref 36.0–46.0)
Hemoglobin: 11.8 g/dL — ABNORMAL LOW (ref 12.0–15.0)
LYMPHS ABS: 1.2 10*3/uL (ref 0.7–4.0)
Lymphocytes Relative: 12 %
MCH: 25.1 pg — AB (ref 26.0–34.0)
MCHC: 32.9 g/dL (ref 30.0–36.0)
MCV: 76.4 fL — ABNORMAL LOW (ref 78.0–100.0)
Monocytes Absolute: 0.6 10*3/uL (ref 0.1–1.0)
Monocytes Relative: 6 %
Neutro Abs: 8.3 10*3/uL — ABNORMAL HIGH (ref 1.7–7.7)
Neutrophils Relative %: 82 %
PLATELETS: 277 10*3/uL (ref 150–400)
RBC: 4.7 MIL/uL (ref 3.87–5.11)
RDW: 17.2 % — ABNORMAL HIGH (ref 11.5–15.5)
WBC: 10.1 10*3/uL (ref 4.0–10.5)

## 2018-04-13 NOTE — Progress Notes (Signed)
Patient ambulating sitting in chair at bedside.  Completed 12 ounces of bari clear fluid.  Protein shake provided.  Patient sleeping off and on, states she had little sleep due to discomfort in bed.  Patient currently in bariatric recliner.  Will continue to follow throughout the day

## 2018-04-13 NOTE — Plan of Care (Signed)

## 2018-04-13 NOTE — Progress Notes (Signed)
Dr Hassell Done paged regarding patent asking for "gas" medication. RN has encouraged patient to ambulate several times during course of the day and she has ambulated three times thus far. Donne Hazel, RN

## 2018-04-13 NOTE — Progress Notes (Signed)
Patient alert and oriented, Post op day 1.  Provided support and encouragement.  Encouraged pulmonary toilet, ambulation and small sips of liquids.  On 5th cup of bari clear fluid. All questions answered.  Will continue to monitor.

## 2018-04-13 NOTE — Progress Notes (Signed)
Patient ID: [name], female   DOB: 1971-01-01, 47 y.o.   MRN: [id number] [facility] Progress Note:   1 Day Post-Op  Subjective: Mental status is clear Objective: Vital signs in last 24 hours: Temp:  [98.6 F (37 C)-99.1 F (37.3 C)] 98.7 F (37.1 C) (07/10 1420) Pulse Rate:  [73-90] 73 (07/10 1420) Resp:  [17-18] 18 (07/10 1420) BP: (142-190)/(64-97) 183/64 (07/10 1420) SpO2:  [97 %-99 %] 99 % (07/10 1420)  Intake/Output from previous day: 07/09 0701 - 07/10 0700 In: 3904.2 [P.O.:180; I.V.:3479.2; IV Piggyback:245] Out: 4400 [Urine:4400] Intake/Output this shift: Total I/O In: 120 [P.O.:120] Out: 1750 [Urine:1750]  Physical Exam: Work of breathing is normal.  Slow advancement of drinking.  Upper abdomen soreness  Lab Results:  Results for orders placed or performed during the hospital encounter of 04/12/18 (from the past 48 hour(s))  Pregnancy, urine STAT morning of surgery     Status: None   Collection Time: 04/12/18  6:03 AM  Result Value Ref Range   Preg Test, Ur NEGATIVE NEGATIVE    Comment:        THE SENSITIVITY OF THIS METHODOLOGY IS >20 mIU/mL. Performed at [facility], [place] [address] [place], [place] [address]   Type and screen [place]     Status: None   Collection Time: 04/12/18  6:30 AM  Result Value Ref Range   ABO/RH(D) O POS    Antibody Screen NEG    Sample Expiration      04/15/2018 Performed at [facility], [place] [address], [place], [place] [address]   ABO/Rh     Status: None   Collection Time: 04/12/18  7:00 AM  Result Value Ref Range   ABO/RH(D)      O POS Performed at [facility], [place] [address], [place], [place] [address]   CBC     Status: Abnormal   Collection Time: 04/12/18 12:02 PM  Result Value Ref Range   WBC 9.0 4.0 - 10.5 K/uL   RBC 4.97 3.87 - 5.11 MIL/uL   Hemoglobin 12.6 12.0 - 15.0 g/dL   HCT 38.0 36.0 - 46.0 %   MCV 76.5 (L)  78.0 - 100.0 fL   MCH 25.4 (L) 26.0 - 34.0 pg   MCHC 33.2 30.0 - 36.0 g/dL   RDW 17.2 (H) 11.5 - 15.5 %   Platelets 279 150 - 400 K/uL    Comment: Performed at [facility], [place] [address] [place], [place] [address]  Creatinine, serum     Status: None   Collection Time: 04/12/18 12:02 PM  Result Value Ref Range   Creatinine, Ser 0.82 0.44 - 1.00 mg/dL   GFR calc non Af Amer >60 >60 mL/min   GFR calc Af Amer >60 >60 mL/min    Comment: (NOTE) The eGFR has been calculated using the CKD EPI equation. This calculation has not been validated in all clinical situations. eGFR's persistently <60 mL/min signify possible Chronic Kidney Disease. Performed at [facility], [place] [address] [place], [place] [address]   CBC WITH DIFFERENTIAL     Status: Abnormal   Collection Time: 04/13/18  4:13 AM  Result Value Ref Range   WBC 10.1 4.0 - 10.5 K/uL   RBC 4.70 3.87 - 5.11 MIL/uL   Hemoglobin 11.8 (L) 12.0 - 15.0 g/dL   HCT 35.9 (L) 36.0 - 46.0 %   MCV 76.4 (L) 78.0 - 100.0 fL  MCH 25.1 (L) 26.0 - 34.0 pg   MCHC 32.9 30.0 - 36.0 g/dL   RDW 17.2 (H) 11.5 - 15.5 %   Platelets 277 150 - 400 K/uL   Neutrophils Relative % 82 %   Neutro Abs 8.3 (H) 1.7 - 7.7 K/uL   Lymphocytes Relative 12 %   Lymphs Abs 1.2 0.7 - 4.0 K/uL   Monocytes Relative 6 %   Monocytes Absolute 0.6 0.1 - 1.0 K/uL   Eosinophils Relative 0 %   Eosinophils Absolute 0.0 0.0 - 0.7 K/uL   Basophils Relative 0 %   Basophils Absolute 0.0 0.0 - 0.1 K/uL    Comment: Performed at [facility], [place] [address] [place], [place] [address]    Radiology/Results: No results found.  Anti-infectives: Anti-infectives (From admission, onward)   Start     Dose/Rate Route Frequency Ordered Stop   04/12/18 0615  cefoTEtan (CEFOTAN) 2 g in sodium chloride 0.9 % 100 mL IVPB     2 g 200 mL/hr over 30 Minutes Intravenous On call to O.R. 04/12/18 0602 04/12/18 0806       Assessment/Plan: Problem List: Patient Active Problem List   Diagnosis Date Noted  . 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 Chest pain 04/27/2011  . Obesity 04/27/2011  . OTITIS MEDIA, ACUTE 02/04/2010  . OTHER ACUTE SINUSITIS 02/04/2010    Doing well but not ready for discharge today.   1 Day Post-Op    LOS: 1 day   Matt B. Hassell Done, MD, [facility] [phone] beeper [phone]  04/13/2018 3:21 PM

## 2018-04-13 NOTE — Progress Notes (Signed)
Patient still with complaints of feeling sleepy.  Gabapentin discontinued after discussing with Dr Hassell Done.  Less than ounce of shake at this time.

## 2018-04-14 ENCOUNTER — Encounter (HOSPITAL_COMMUNITY): Payer: Self-pay

## 2018-04-14 LAB — CBC WITH DIFFERENTIAL/PLATELET
BASOS ABS: 0 10*3/uL (ref 0.0–0.1)
BASOS PCT: 0 %
Eosinophils Absolute: 0 10*3/uL (ref 0.0–0.7)
Eosinophils Relative: 0 %
HEMATOCRIT: 40.2 % (ref 36.0–46.0)
HEMOGLOBIN: 12.9 g/dL (ref 12.0–15.0)
Lymphocytes Relative: 25 %
Lymphs Abs: 1.9 10*3/uL (ref 0.7–4.0)
MCH: 24.8 pg — ABNORMAL LOW (ref 26.0–34.0)
MCHC: 32.1 g/dL (ref 30.0–36.0)
MCV: 77.2 fL — ABNORMAL LOW (ref 78.0–100.0)
Monocytes Absolute: 0.7 10*3/uL (ref 0.1–1.0)
Monocytes Relative: 9 %
NEUTROS ABS: 4.9 10*3/uL (ref 1.7–7.7)
NEUTROS PCT: 66 %
PLATELETS: 328 10*3/uL (ref 150–400)
RBC: 5.21 MIL/uL — AB (ref 3.87–5.11)
RDW: 17.3 % — ABNORMAL HIGH (ref 11.5–15.5)
WBC: 7.5 10*3/uL (ref 4.0–10.5)

## 2018-04-14 MED ORDER — OXYCODONE HCL 5 MG/5ML PO SOLN: 5.0000 mg | ORAL | 0 refills | Status: AC | PRN

## 2018-04-14 MED ORDER — LIP MEDEX EX OINT
TOPICAL_OINTMENT | CUTANEOUS | Status: AC
  Administered 2018-04-14: 11:00:00
  Filled 2018-04-14: qty 7

## 2018-04-14 MED ORDER — ONDANSETRON 4 MG PO TBDP: 4.0000 mg | ORAL_TABLET | Freq: Four times a day (QID) | ORAL | 0 refills | Status: AC | PRN

## 2018-04-14 MED ORDER — PANTOPRAZOLE SODIUM 40 MG PO TBEC: 40.0000 mg | DELAYED_RELEASE_TABLET | Freq: Every day | ORAL | 0 refills | Status: AC

## 2018-04-14 NOTE — Progress Notes (Signed)

## 2018-04-14 NOTE — Progress Notes (Signed)
Patient alert and oriented, Post op day 2.  Provided support and encouragement.  Encouraged pulmonary toilet, ambulation and small sips of liquids.  All questions answered.  Will continue to monitor. 

## 2018-04-14 NOTE — Discharge Summary (Signed)
Physician Discharge Summary  Patient ID: [name] MRN: [id number] DOB/AGE: Oct 15, 1970 47 y.o.  PCP: Linus Salmons, PA-C  Admit date: 04/12/2018 Discharge date: 04/14/2018  Admission Diagnoses:  Morbid obesity  Discharge Diagnoses:  same  Active Problems:   S/P laparoscopic sleeve gastrectomy   Surgery:  Sleeve gastrectomy  Discharged Condition: improved  Hospital Course:   Had surgery on Tuesday;  Begun on liquids and needed to stay until PD 2 for adequate intake.    Consults: none  Significant Diagnostic Studies: path pending    Discharge Exam: Blood pressure (!) 178/83, pulse 72, temperature 98.7 F (37.1 C), temperature source Oral, resp. rate 14, height 5\' 4"  (1.626 m), weight (!) 152 kg (335 lb), SpO2 98 %. Incisions ok  Disposition: Discharge disposition: 01-Home or Self Care       Discharge Instructions    Ambulate hourly while awake   Complete by:  As directed    Call MD for:  difficulty breathing, headache or visual disturbances   Complete by:  As directed    Call MD for:  persistant dizziness or light-headedness   Complete by:  As directed    Call MD for:  persistant nausea and vomiting   Complete by:  As directed    Call MD for:  redness, tenderness, or signs of infection (pain, swelling, redness, odor or green/yellow discharge around incision site)   Complete by:  As directed    Call MD for:  severe uncontrolled pain   Complete by:  As directed    Call MD for:  temperature >101 F   Complete by:  As directed    Diet bariatric full liquid   Complete by:  As directed    Incentive spirometry   Complete by:  As directed    Perform hourly while awake     Allergies as of 04/14/2018   No Known Allergies     Medication List    STOP taking these medications   naproxen sodium 220 MG tablet Commonly known as:  ALEVE     TAKE these medications   lisinopril 5 MG tablet Commonly known as:  PRINIVIL,ZESTRIL Take 5 mg by mouth  daily. Notes to patient:  Monitor Blood Pressure Daily and keep a log for primary care physician.  You may need to make changes to your medications with rapid weight loss.     LUPRON DEPOT (37-MONTH) 11.25 MG injection Generic drug:  leuprolide Inject into the muscle every 3 (three) months.   multivitamin tablet Take 1 tablet by mouth daily. BARIATRIC MULTIVITAMIN   ondansetron 4 MG disintegrating tablet Commonly known as:  ZOFRAN-ODT Take 1 tablet (4 mg total) by mouth every 6 (six) hours as needed for nausea or vomiting.   oxyCODONE 5 MG/5ML solution Commonly known as:  ROXICODONE Take 5-10 mLs (5-10 mg total) by mouth every 4 (four) hours as needed for moderate pain or severe pain.   pantoprazole 40 MG tablet Commonly known as:  PROTONIX Take 40 mg by mouth every evening. What changed:  Another medication with the same name was added. Make sure you understand how and when to take each.   pantoprazole 40 MG tablet Commonly known as:  PROTONIX Take 1 tablet (40 mg total) by mouth daily. What changed:  You were already taking a medication with the same name, and this prescription was added. Make sure you understand how and when to take each.      Follow-up Information    [facility]. Go on 04/27/2018.   Specialty:  General Surgery Why:  at 9 am with Dr Kaylyn Lim.  Please arrive 15 minutes early for your appointment Contact information: [address] [place] [address] [phone]        Surgery, [place] .   Specialty:  General Surgery Contact information: [address] [place] [address] [phone]           Signed: Pedro Earls 04/14/2018, 11:39 AM

## 2018-04-18 ENCOUNTER — Telehealth (HOSPITAL_COMMUNITY): Payer: Self-pay

## 2018-04-18 NOTE — Telephone Encounter (Signed)
Patient called to discuss post bariatric surgery follow up questions.  See below:   1.  Tell me about your pain and pain management?no pain except for gas.  Walking alot  2.  Let's talk about fluid intake.  How much total fluid are you taking in?43 ounces of fluids  3.  How much protein have you taken in the last 2 days?30 grams discussed increasing amt of protein  4.  Have you had nausea?  Tell me about when have experienced nausea and what you did to help?has not had nausea  5.  Has the frequency or color changed with your urine?urinating frequently light in color  6.  Tell me what your incisions look like?look good no problem  7.  Have you been passing gas? BM?passing gas had bm  8.  If a problem or question were to arise who would you call?  Do you know contact numbers for [facility], [facility], and [facility]?aware of contact inforamtion  9.  How has the walking going?walking a lot around her home and takes her dog for walks outside mutiple times per day  10.  How are your vitamins and calcium going?  How are you taking them?taking multivitamin does not like multi and has ordered another kind.  Has not started calcium she is going to pick up today.  We discussed schedule and how to pick out calcium supplement

## 2018-04-25 ENCOUNTER — Encounter: Payer: BLUE CROSS/BLUE SHIELD | Attending: Surgery | Admitting: Dietician

## 2018-04-25 ENCOUNTER — Encounter: Payer: Self-pay | Admitting: Dietician

## 2018-04-25 VITALS — Ht 64.0 in | Wt 314.7 lb

## 2018-04-25 DIAGNOSIS — Z6841 Body Mass Index (BMI) 40.0 and over, adult: Secondary | ICD-10-CM | POA: Diagnosis not present

## 2018-04-25 NOTE — Progress Notes (Signed)
Follow-up visit:  2 Weeks Post-Operative Sleeve Gastrectomy Surgery  Medical Nutrition Therapy:  Appt start time: 1330 end time:  9622.  Primary concerns today: Post-operative Bariatric Surgery Nutrition Management.  Preferred Learning Style:   Auditory  Visual  Hands on   Learning Readiness:   Change in progress  Weight: 314.7lbs Height: 5'4" Weight at previous NDES visit: 342.1lbs Weight loss of 27.4lbs since 01/05/18     Dietary recall: Breakfast: protein shake finishes in 1 hour Snack: water, decaf tea with sugar free flavoring Lunch: has tried cream soups, has not found any suitable Snack: water, popsicle  Dinner: 1/2 protein shake Snack: water  Fluid intake: 17oz shakes + 32oz water/ tea = 49oz fluids daily Estimated total protein intake: 1 1/2 Premier protein shakes daily = 45grams protein daily  Medications: lisinopril, LUPRON DEPOT injection every 3 months, pantoprazole Supplementation: bariatric vitamins 2x daily + calcium carbonate 3x daily  Using straws: no Drinking while eating: no solid foods yet Hair loss: none Carbonated beverages: none N/V/D/C: none Dumping syndrome: no   Recent physical activity:  has begun walking dog once daily + adding movement throughout the day.  Progress Towards Goal(s):  In progress.  Handouts given during visit include:  Phase III and Phase IV bariatric diet    Nutritional Diagnosis:  Pratt-3.3 Overweight/obesity As related to history of excess calories and inactivity.  As evidenced by patient with BMI of 54, now 2 weeks after sleeve gastrectomy, and following bariatric diet.    Intervention:  Instructed patient on transition to solid protein foods.   Discussed importance of ongoing adherence to the bariatric diet to avoid side effects, promote healing, and for successful weight loss.    Discussed dealing with special occasions and behavior chains that can lead to unhealthy choices.    Patient is ready to advance her  diet; she agrees to track her intake to ensure she reaches protein and fluid goals.  Teaching Method Utilized:  Visual Auditory Hands on  Barriers to learning/adherence to lifestyle change: none  Demonstrated degree of understanding via:  Teach Back   Monitoring/Evaluation:  Dietary intake, exercise, and body weight. Follow up in 6-7 weeks for 2 month post-op visit.

## 2018-04-25 NOTE — Patient Instructions (Signed)
   Begin eating some solid protein foods, cooked soft and moist. Take small bites and chew each bite thoroughly.   Track protein intake daily and continue to increase towards 60 grams daily.   Continue to increase fluids as able towards goal of 64oz daily (from all sources).  Consider using unflavored protein powder to add to foods as suitable to boost daily intake.

## 2018-04-26 ENCOUNTER — Ambulatory Visit: Payer: BLUE CROSS/BLUE SHIELD | Admitting: Dietician

## 2018-05-02 ENCOUNTER — Telehealth (HOSPITAL_COMMUNITY): Payer: Self-pay

## 2018-05-02 NOTE — Telephone Encounter (Signed)
Patient had some problems with constipation took milk of magnesia which helped constipation, but increased pain from internal hemorrhoids. Patient called Dr [name] office and is awaiting return call.  Instructed patient to try warm bath, ice pack, and tylenol to help with discomfort.

## 2018-05-12 ENCOUNTER — Telehealth: Payer: Self-pay | Admitting: Dietician

## 2018-05-12 NOTE — Telephone Encounter (Signed)
Called in response to [name]'s call and message regarding questions she had about the post bariatric surgery diet progression. Discussed progression from soft protein foods to including soft cooked vegetables such as cooked carrots, green peas, etc. Stressed again the importance of goal of 64 gms of protein daily and 64 oz fluid. Also, stressed again importance of chewing food to the consistency of applesauce to aid in digestion.  Encouraged her to call if has further questions before her next follow-up appointment.

## 2018-06-09 ENCOUNTER — Encounter: Payer: BLUE CROSS/BLUE SHIELD | Attending: Surgery | Admitting: Dietician

## 2018-06-09 ENCOUNTER — Encounter: Payer: Self-pay | Admitting: Dietician

## 2018-06-09 VITALS — Ht 64.0 in | Wt 301.2 lb

## 2018-06-09 DIAGNOSIS — Z6841 Body Mass Index (BMI) 40.0 and over, adult: Secondary | ICD-10-CM

## 2018-06-09 NOTE — Patient Instructions (Addendum)
   Great job meeting protein and fluid goals, keep it up!  Increase variety of foods gradually. Continue to avoid starchy foods.   Maintain regular exercise to keep metabolism up and promote long-term weight loss success.

## 2018-06-09 NOTE — Progress Notes (Signed)
Follow-up visit:  2 Months Post-Operative Sleeve Gastrectomy Surgery  Medical Nutrition Therapy:  Appt start time: 1330 end time:  1749.  Primary concerns today: Post-operative Bariatric Surgery Nutrition Management.  Preferred Learning Style:   Auditory  Visual  Hands on   Learning Readiness:   Change in progress  Weight: 301.2lbs Height: 5'4" Weight at previous NDES visit: 314.7lbs Weight loss of 13.5lbs since previous visit on 04/25/18; BMI decreased from 54 to 51     Dietary recall:  Patient was having difficulty fitting in breakfast and lunch meals, so resumed drinking protein shake for breakfast. She is getting somewhat bored with protein options (beef feels heavy in stomach; chicken does not appeal anymore; does not like some cheeses) Breakfast: decaf coffee, protein shake Snack: none  Lunch: salad with Romaine, tomato, peeled cucumber, onion, and either deli [place] or chopped chicken, light balsamic or occasionally low-carb light ranch Snack: none  Dinner: 2oz fish, veg; stir-fried veg with 2oz shrimp and chicken; some chicken in salads. Tried a few bites of sweet potato at a restaurant, didn't care for the taste.  Snack: occasional sugar free popsicle or fruit cup or applesauce  Fluid intake: 20oz coffee, nondairy sf creamer, 11oz protein shake, 48oz water = 79oz fluids Estimated total protein intake: 60grams  Medications: lisinopril, LUPRON DEPOT injection every 3 mos., ondansetron prn, pantoprazole Supplementation: Bariatric Fusion softchews multivitamin 2x daily; calcium carbonate (Tums) 3x daily  Using straws: once, no problems Drinking while eating: no Hair loss: maybe slight hair loss per patient, unsure if due to surgery or normal shedding Carbonated beverages: no N/V/D/C: 1 episode of diarrhea and vomiting for about 12 hours, patient feels unrelated to diet Dumping syndrome: no   Recent physical activity:  Gym 3x a week, walking 3x a week with some hand  weight exercises  Progress Towards Goal(s):  In progress.  Handouts given during visit include:  Phase V and Phase VI Bariatric diet  Perfect Protein (AND)  Vegetarian Proteins handout   Nutritional Diagnosis:  Stacy-3.3 Overweight/obesity As related to history of excess calories and inactivity.  As evidenced by patient having undergone Sleeve Gastrectomy surgery, now following guidelines for bariatric diet, with current BMI of 51.7.    Intervention:  Reviewed goals for protein intake, and discussed protein options; encouraged unflavored protein powder as backup protein source.   Patient asked about including some starches; advised avoidance other than beans, peas, corn (patient asked about corn specifically), lower-glycemic potatoes.    Commended patient for progress with physical activity and weight loss.    Set goals to further advance diet gradually for next 4 months.   Teaching Method Utilized:  Visual Auditory Hands on  Barriers to learning/adherence to lifestyle change: none  Demonstrated degree of understanding via:  Teach Back   Monitoring/Evaluation:  Dietary intake, exercise, and body weight. Follow up in 4 months for 6 month post-op visit.

## 2018-06-27 IMAGING — CT CT RENAL STONE PROTOCOL
2 of 4 series · 16 of 46 positions shown, 18 images · non-contrast
Comparison: None.

CLINICAL DATA: Left flank pain

EXAM:
CT ABDOMEN AND PELVIS WITHOUT CONTRAST
TECHNIQUE: Multidetector CT imaging of the abdomen and pelvis was performed
following the standard protocol without IV contrast.

[Series 2: stone full standard · axial · 0.79mm/px · z∈[-968,-483]mm · 13 of 107 slices shown, 15 images]
[im 5/107  soft-tissue]
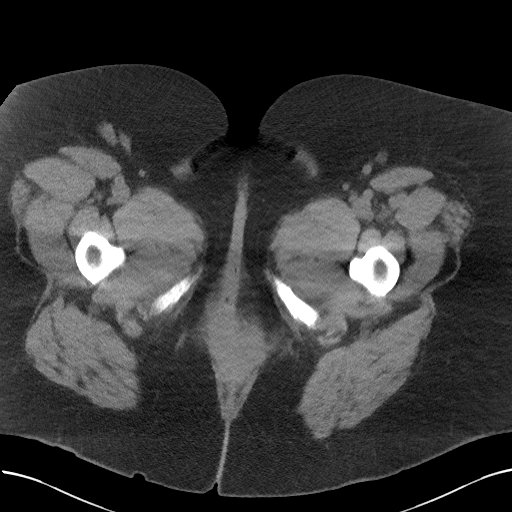
[im 5/107  bone]
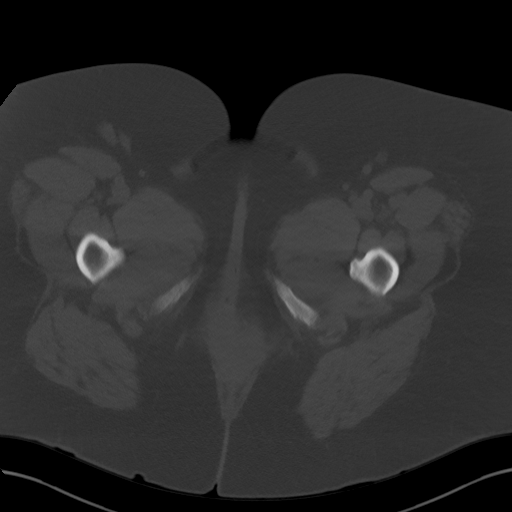
[im 14/107  soft-tissue]
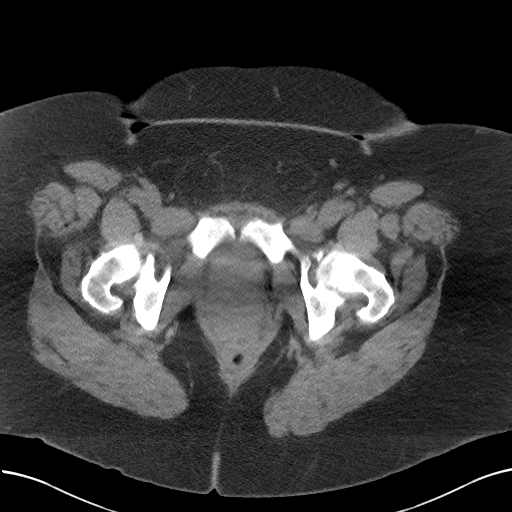
[im 23/107  soft-tissue]
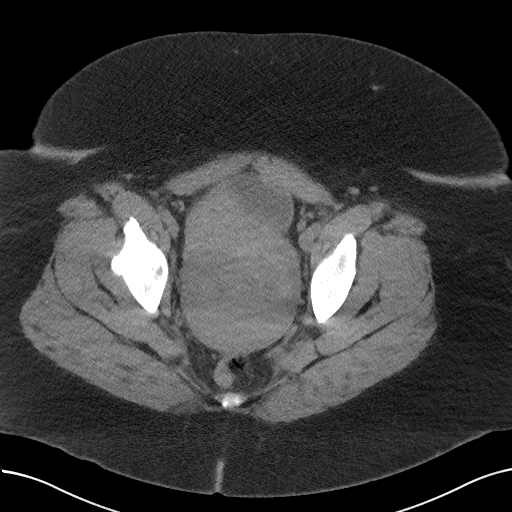
[im 31/107  soft-tissue]
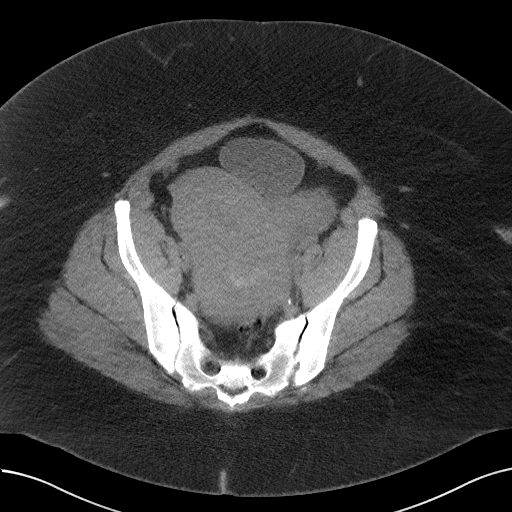
[im 36/107  soft-tissue]
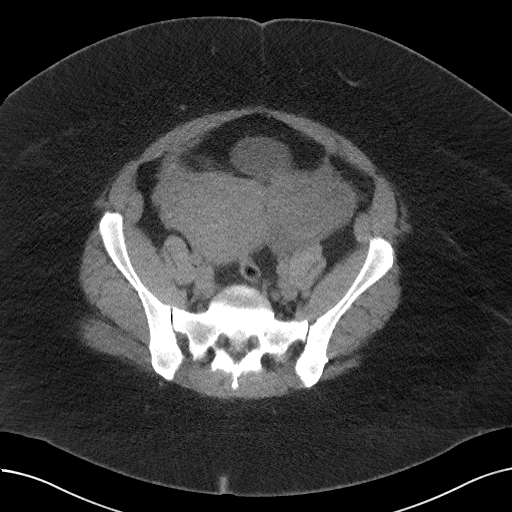
[im 45/107  soft-tissue]
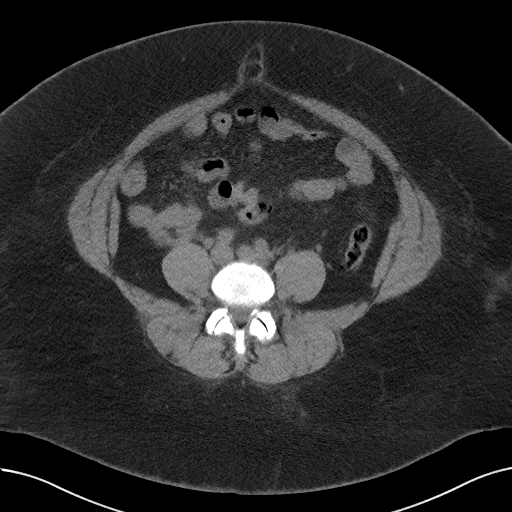
[im 54/107  soft-tissue]
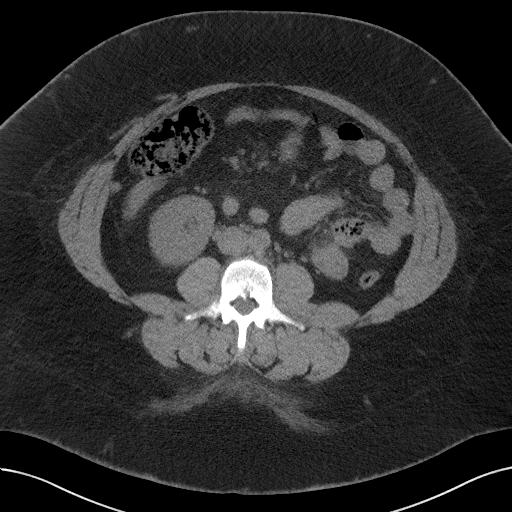
[im 62/107  soft-tissue]
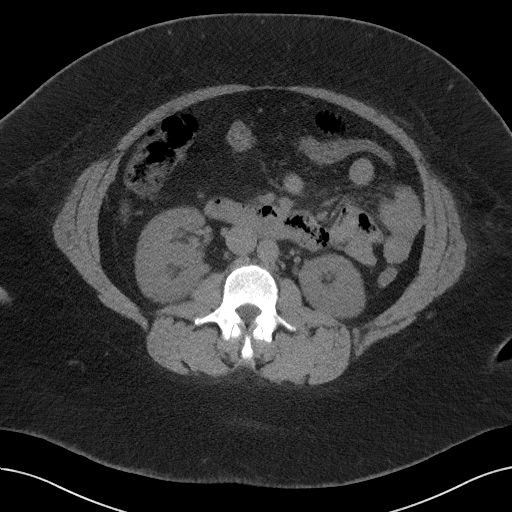
[im 71/107  soft-tissue]
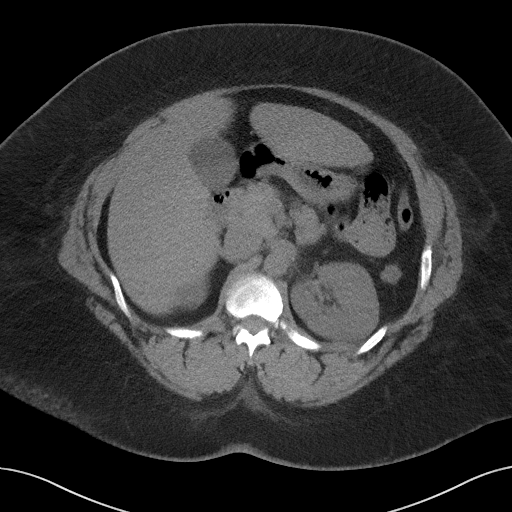
[im 71/107  bone]
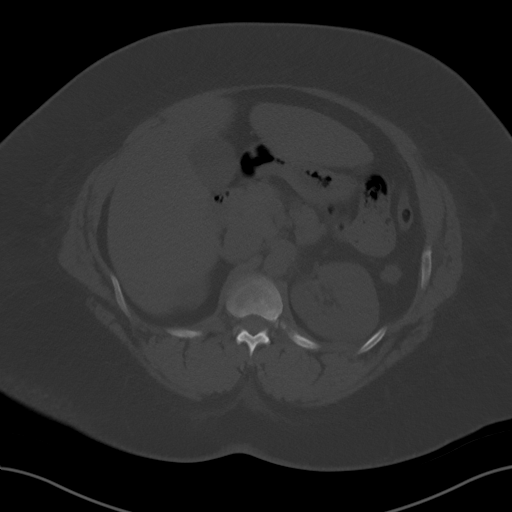
[im 76/107  soft-tissue]
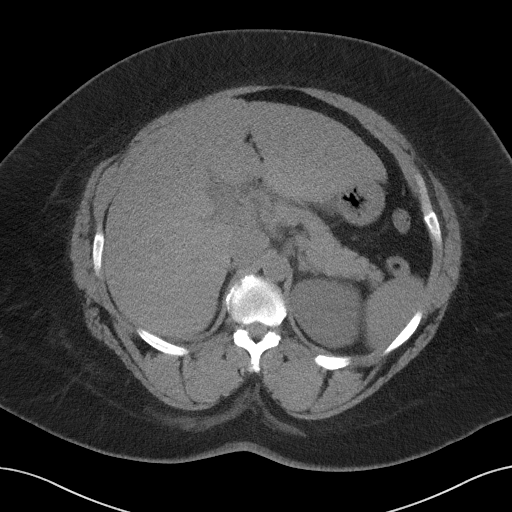
[im 84/107  soft-tissue]
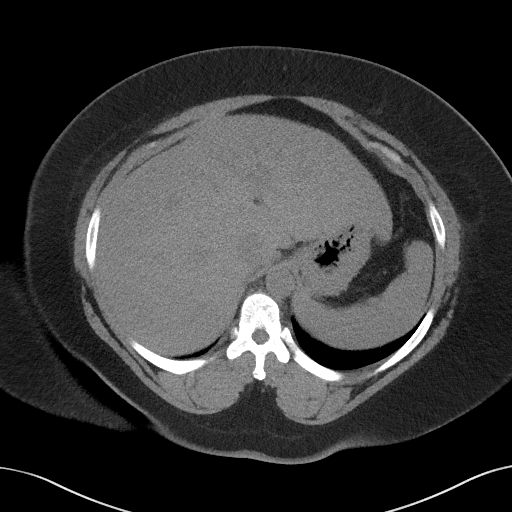
[im 93/107  soft-tissue]
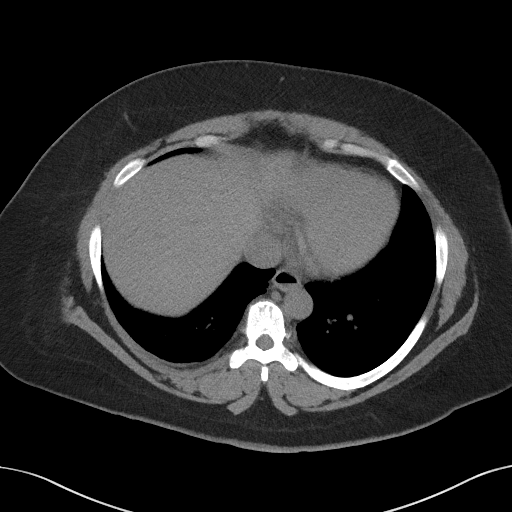
[im 102/107  soft-tissue]
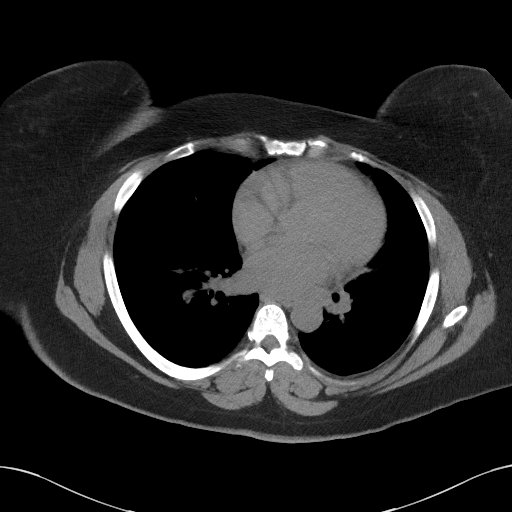

[Series 5: coronal · coronal · 0.84mm/px · 3 of 158 slices shown]
[im 53/158  soft-tissue]
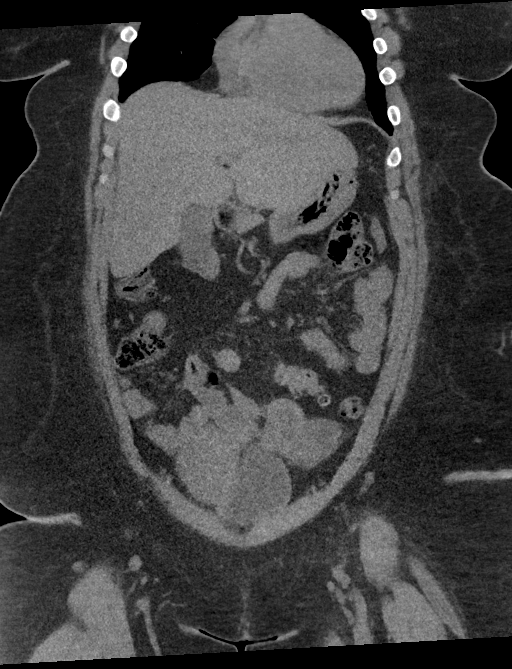
[im 70/158  soft-tissue]
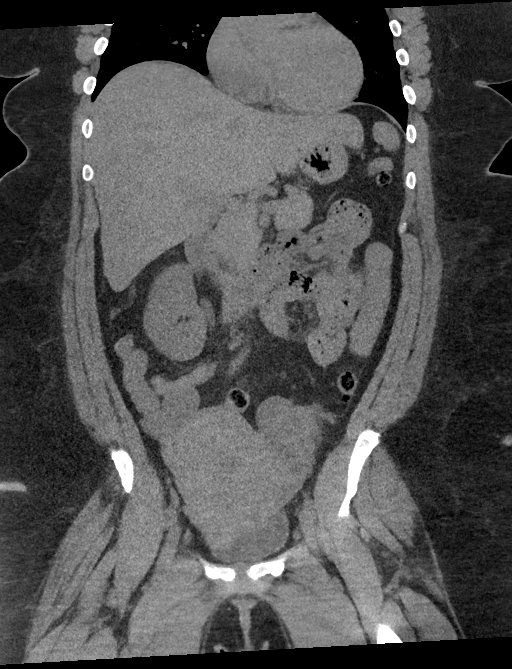
[im 88/158  soft-tissue]
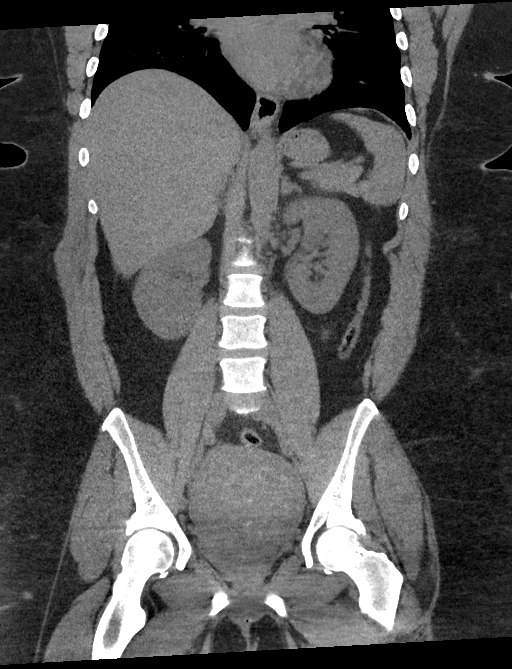

[16 of 46 positions shown; findings below may reference images not displayed]

FINDINGS: Lower chest: Lung bases are clear.

Hepatobiliary: Unenhanced liver is unremarkable.

Layering gallbladder sludge versus noncalcified gallstones (series
2/image 37). No associated inflammatory changes.

Pancreas: Within normal limits.

Spleen: Within normal limits.

Adrenals/Urinary Tract: Adrenal glands are within normal limits.

Kidneys are within normal limits. No renal, ureteral, or bladder
calculi. No hydronephrosis.

Bladder is underdistended but unremarkable.

Stomach/Bowel: Stomach is notable for a tiny hiatal hernia.

No evidence of bowel obstruction.

Normal appendix (series 2/image 55).

Vascular/Lymphatic: No evidence of abdominal aortic aneurysm.

No suspicious abdominopelvic lymphadenopathy.

Reproductive: Enlarged uterus with suspected uterine fibroids,
including a 6.2 x 6.3 x 4.4 cm partially calcified intramural
fibroid in the left lower uterine body (coronal image 88).

Right ovary is grossly unremarkable (series 2/image 67).

Left ovary is enlarged and notable for a 7.2 x 4.3 cm bilobed
hyperdense lesion (series 2/image 69), possibly reflecting a
hemorrhagic cyst versus endometrioma, although poorly evaluated on
unenhanced CT.

Other: No abdominopelvic ascites.

Musculoskeletal: Mild degenerative changes of the visualized
thoracolumbar spine.
IMPRESSION: 7.2 x 4.3 cm bilobed hyperdense left ovarian lesion, possibly
reflecting a hemorrhagic cyst versus endometrioma, although poorly
evaluated on unenhanced CT. Consider pelvic ultrasound for initial
evaluation.

Enlarged uterus with suspected uterine fibroids, including a
dominant 6.3 cm calcified intramural fibroid in the left uterine
body. This can be assessed at the time of ultrasound.

Layering gallbladder sludge versus noncalcified gallstones, without
associated inflammatory changes.

## 2018-10-13 ENCOUNTER — Ambulatory Visit: Payer: BLUE CROSS/BLUE SHIELD | Admitting: Dietician

## 2018-10-14 ENCOUNTER — Encounter: Payer: Self-pay | Admitting: Dietician

## 2018-10-14 NOTE — Progress Notes (Signed)
[name] rescheduled her MNT follow-up appointment from 10/13/18 to 10/21/18.

## 2018-10-21 ENCOUNTER — Ambulatory Visit: Payer: Self-pay | Admitting: Dietician

## 2018-10-26 ENCOUNTER — Telehealth: Payer: Self-pay | Admitting: Dietician

## 2018-10-26 NOTE — Telephone Encounter (Signed)
Called patient to reschedule her cancelled appointment from 10/21/18; left a voicemail message requesting a call back.

## 2018-11-11 ENCOUNTER — Encounter: Payer: Self-pay | Admitting: Dietician

## 2018-11-11 NOTE — Progress Notes (Signed)
Have not heard back from patient to reschedule her missed appointment from 10/21/18. Sent letter to referring provider.

## 2019-10-02 ENCOUNTER — Encounter (HOSPITAL_COMMUNITY): Payer: Self-pay

## 2020-01-13 ENCOUNTER — Ambulatory Visit: Payer: BC Managed Care – PPO | Attending: Oncology

## 2020-01-13 DIAGNOSIS — Z23 Encounter for immunization: Secondary | ICD-10-CM

## 2020-01-13 NOTE — Progress Notes (Signed)
   Covid-19 Vaccination Clinic  Name:  [name]    MRN: [id number] DOB: Jan 07, 1971  01/13/2020  [name] was observed post Covid-19 immunization for 15 minutes without incident. She was provided with Vaccine Information Sheet and instruction to access the V-Safe system.   [name] was instructed to call 911 with any severe reactions post vaccine: Marland Kitchen Difficulty breathing  . Swelling of face and throat  . A fast heartbeat  . A bad rash all over body  . Dizziness and weakness   Immunizations Administered    Name Date Dose VIS Date Route   Pfizer COVID-19 Vaccine 01/13/2020  9:43 AM 0.3 mL 09/15/2019 Intramuscular   Manufacturer: [place]   Lot: [id number]   [place]: [id number]

## 2020-02-03 ENCOUNTER — Ambulatory Visit: Payer: BC Managed Care – PPO | Attending: Oncology

## 2020-02-03 DIAGNOSIS — Z23 Encounter for immunization: Secondary | ICD-10-CM

## 2020-02-03 NOTE — Progress Notes (Signed)
   Covid-19 Vaccination Clinic  Name:  [name]    MRN: [id number] DOB: 01-Mar-1971  02/03/2020  [name] was observed post Covid-19 immunization for 15 minutes without incident. She was provided with Vaccine Information Sheet and instruction to access the V-Safe system.   [name] was instructed to call 911 with any severe reactions post vaccine: Marland Kitchen Difficulty breathing  . Swelling of face and throat  . A fast heartbeat  . A bad rash all over body  . Dizziness and weakness   Immunizations Administered    Name Date Dose VIS Date Route   Pfizer COVID-19 Vaccine 02/03/2020 10:01 AM 0.3 mL 11/29/2018 Intramuscular   Manufacturer: [place]   Lot: [id number]   [place]: [id number]

## 2020-02-06 ENCOUNTER — Ambulatory Visit: Payer: BC Managed Care – PPO

## 2020-09-30 ENCOUNTER — Ambulatory Visit
Admission: EM | Admit: 2020-09-30 | Discharge: 2020-09-30 | Discharge status: Absent without leave | Payer: BC Managed Care – PPO

## 2020-10-07 ENCOUNTER — Encounter: Payer: Self-pay | Admitting: Emergency Medicine

## 2020-10-07 ENCOUNTER — Emergency Department
Admission: EM | Admit: 2020-10-07 | Discharge: 2020-10-07 | Disposition: A | Discharge status: Home / Self Care | Payer: BC Managed Care – PPO | Attending: Emergency Medicine | Admitting: Emergency Medicine

## 2020-10-07 ENCOUNTER — Other Ambulatory Visit: Payer: Self-pay

## 2020-10-07 ENCOUNTER — Emergency Department: Payer: BC Managed Care – PPO

## 2020-10-07 ENCOUNTER — Other Ambulatory Visit
Admission: RE | Admit: 2020-10-07 | Discharge: 2020-10-07 | Disposition: A | Discharge status: Home / Self Care | Payer: BC Managed Care – PPO | Source: Ambulatory Visit | Attending: Student | Admitting: Student

## 2020-10-07 DIAGNOSIS — I10 Essential (primary) hypertension: Secondary | ICD-10-CM | POA: Insufficient documentation

## 2020-10-07 DIAGNOSIS — R0602 Shortness of breath: Secondary | ICD-10-CM

## 2020-10-07 DIAGNOSIS — R791 Abnormal coagulation profile: Secondary | ICD-10-CM | POA: Insufficient documentation

## 2020-10-07 DIAGNOSIS — R399 Unspecified symptoms and signs involving the genitourinary system: Secondary | ICD-10-CM | POA: Diagnosis present

## 2020-10-07 DIAGNOSIS — Z87891 Personal history of nicotine dependence: Secondary | ICD-10-CM | POA: Insufficient documentation

## 2020-10-07 DIAGNOSIS — U071 COVID-19: Secondary | ICD-10-CM

## 2020-10-07 DIAGNOSIS — Z79899 Other long term (current) drug therapy: Secondary | ICD-10-CM | POA: Insufficient documentation

## 2020-10-07 LAB — CBC WITH DIFFERENTIAL/PLATELET
Abs Immature Granulocytes: 0.05 10*3/uL (ref 0.00–0.07)
Basophils Absolute: 0 10*3/uL (ref 0.0–0.1)
Basophils Relative: 1 %
Eosinophils Absolute: 0.2 10*3/uL (ref 0.0–0.5)
Eosinophils Relative: 3 %
HCT: 40 % (ref 36.0–46.0)
Hemoglobin: 12.9 g/dL (ref 12.0–15.0)
Immature Granulocytes: 1 %
Lymphocytes Relative: 39 %
Lymphs Abs: 2.2 10*3/uL (ref 0.7–4.0)
MCH: 26.2 pg (ref 26.0–34.0)
MCHC: 32.3 g/dL (ref 30.0–36.0)
MCV: 81.1 fL (ref 80.0–100.0)
Monocytes Absolute: 0.4 10*3/uL (ref 0.1–1.0)
Monocytes Relative: 7 %
Neutro Abs: 2.9 10*3/uL (ref 1.7–7.7)
Neutrophils Relative %: 49 %
Platelets: 483 10*3/uL — ABNORMAL HIGH (ref 150–400)
RBC: 4.93 MIL/uL (ref 3.87–5.11)
RDW: 14.5 % (ref 11.5–15.5)
Smear Review: NORMAL
WBC: 5.6 10*3/uL (ref 4.0–10.5)
nRBC: 0 % (ref 0.0–0.2)

## 2020-10-07 LAB — BASIC METABOLIC PANEL
Anion gap: 8 (ref 5–15)
BUN: 10 mg/dL (ref 6–20)
CO2: 24 mmol/L (ref 22–32)
Calcium: 8.4 mg/dL — ABNORMAL LOW (ref 8.9–10.3)
Chloride: 106 mmol/L (ref 98–111)
Creatinine, Ser: 0.73 mg/dL (ref 0.44–1.00)
GFR, Estimated: >60 mL/min (ref >60)
Glucose, Bld: 88 mg/dL (ref 70–99)
Potassium: 4 mmol/L (ref 3.5–5.1)
Sodium: 138 mmol/L (ref 135–145)

## 2020-10-07 LAB — FIBRIN DERIVATIVES D-DIMER (ARMC ONLY): Fibrin derivatives D-dimer (ARMC): 4271.03 ng/mL (FEU) — ABNORMAL HIGH (ref 0.00–499.00)

## 2020-10-07 LAB — POC URINE PREG, ED: Preg Test, Ur: NEGATIVE

## 2020-10-07 MED ORDER — SODIUM CHLORIDE 0.9 % IV BOLUS
1000.0000 mL | Freq: Once | INTRAVENOUS | Status: AC
Start: 2020-10-07 — End: 2020-10-07
  Administered 2020-10-07: 1000 mL via INTRAVENOUS

## 2020-10-07 MED ORDER — IOHEXOL 350 MG/ML SOLN
75.0000 mL | Freq: Once | INTRAVENOUS | Status: AC | PRN
  Administered 2020-10-07: 75 mL via INTRAVENOUS
  Filled 2020-10-07: qty 75

## 2020-10-07 NOTE — ED Triage Notes (Addendum)
Patient to ER for c/o shortness of breath, Covid+ (at home test and confirmed at [facility] today), +D-dimer. Patient had labs and CXR done today at [facility].  Had s/s of UTI on 12/24, just picked up prescription for UTI. +Lower abdomen/pelvic cramping.

## 2020-10-07 NOTE — Discharge Instructions (Addendum)
Your lab tests and CT scan of the chest today were all okay.  Please take the antibiotics prescribed to you for your urinary tract infection follow-up with your doctor.  You can take anti-inflammatory medicine as needed for pain or fever.

## 2020-10-07 NOTE — ED Provider Notes (Signed)
[facility] Emergency Department Provider Note  ____________________________________________  Time seen: Approximately 2:36 PM  I have reviewed the triage vital signs and the nursing notes.   HISTORY  Chief Complaint Shortness of Breath and Abnormal Lab    HPI [name] is a 50 y.o. female with a history of GERD, hypertension, obesity, uterine fibroids who is sent to the ED by [facility] clinic due to elevated D-dimer.  Patient had Covid exposure on Christmas Day from her daughter and grandchildren who turned out to be sick and Covid positive on December 26.  Patient subsequently tested herself and found herself to be Covid positive as well.  She reports dyspnea on exertion which feels like it is improving.  Shortness of breath is resolved at rest.  Denies chest pain.  No pleuritic symptoms.  No dizziness or syncope.  Overall she feels like her illness has been mild and is getting better.  She has been on exogenous hormones for the past 2 months due to her uterine fibroid symptoms.  No history of DVT or PE or clotting disorder.  No blood thinner use.  No recent immobility or surgery.  She was seen at [facility] clinic this morning for shortness of breath, started on Levaquin to cover UTI and possible opacity on chest x-ray.  A D-dimer was obtained which resulted at 4200, necessitating further evaluation.    Past Medical History:  Diagnosis Date  . Generalized headaches   . GERD (gastroesophageal reflux disease)   . History of Helicobacter pylori infection 2017   RESOLVED W/ TREATMENT  . Hypertension   . Morbid obesity (HCC)   . Uterine leiomyoma   . Wears glasses   . Wears partial dentures    upper only     Patient Active Problem List   Diagnosis Date Noted  . 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 Chest pain 04/27/2011  . Obesity 04/27/2011  . OTITIS MEDIA, ACUTE 02/04/2010  . OTHER ACUTE SINUSITIS 02/04/2010     Past Surgical  History:  Procedure Laterality Date  . LAPAROSCOPIC GASTRIC SLEEVE RESECTION N/A 04/12/2018   Procedure: LAPAROSCOPIC GASTRIC SLEEVE RESECTION WITH UPPER ENDO AND ERAS PATHWAY;  Surgeon: Luretha Murphy, MD;  Location: [facility] ORS;  Service: General;  Laterality: N/A;  . NO PAST SURGERIES       Prior to Admission medications   Medication Sig Start Date End Date Taking? Authorizing Provider  lisinopril (PRINIVIL,ZESTRIL) 5 MG tablet Take 5 mg by mouth daily.    [provider]  LUPRON DEPOT, 1-MONTH, 11.25 MG injection Inject into the muscle every 3 (three) months.  12/17/17   [provider]  Multiple Vitamin (MULTIVITAMIN) tablet Take 1 tablet by mouth daily. BARIATRIC MULTIVITAMIN    [provider]  ondansetron (ZOFRAN-ODT) 4 MG disintegrating tablet Take 1 tablet (4 mg total) by mouth every 6 (six) hours as needed for nausea or vomiting. 04/14/18   Luretha Murphy, MD  oxyCODONE (ROXICODONE) 5 MG/5ML solution Take 5-10 mLs (5-10 mg total) by mouth every 4 (four) hours as needed for moderate pain or severe pain. 04/14/18   Luretha Murphy, MD  pantoprazole (PROTONIX) 40 MG tablet Take 40 mg by mouth every evening.     [provider]  pantoprazole (PROTONIX) 40 MG tablet Take 1 tablet (40 mg total) by mouth daily. 04/14/18   Luretha Murphy, MD     Allergies Patient has no known allergies.   Family History  Problem Relation Age of Onset  .  Coronary artery disease Other        father side of family  . Stroke Mother   . Coronary artery disease Father   . Hypertension Other     Social History Social History   Tobacco Use  . Smoking status: Former Smoker    Years: 20.00    Types: Cigarettes    Quit date: 10/05/2014    Years since quitting: 6.0  . Smokeless tobacco: Never Used  Vaping Use  . Vaping Use: Never used  Substance Use Topics  . Alcohol use: Yes    Comment: OCCASIONAL  . Drug use: No    Review of Systems  Constitutional:   No fever  or chills.  ENT:   No sore throat. No rhinorrhea. Cardiovascular:   No chest pain or syncope. Respiratory:   Positive shortness of breath on exertion and nonproductive cough. Gastrointestinal:   Positive pelvic cramping pain consistent with fibroids and menses.  Positive dysuria Musculoskeletal:   Negative for focal pain or swelling All other systems reviewed and are negative except as documented above in ROS and HPI.  ____________________________________________   PHYSICAL EXAM:  VITAL SIGNS: ED Triage Vitals  Enc Vitals Group     BP 10/07/20 1412 (!) 157/85     Pulse Rate 10/07/20 1412 91     Resp 10/07/20 1412 20     Temp 10/07/20 1412 98.4 F (36.9 C)     Temp Source 10/07/20 1412 Oral     SpO2 10/07/20 1412 98 %     Weight 10/07/20 1409 (!) 301 lb 2.4 oz (136.6 kg)     Height 10/07/20 1409 5\' 4"  (1.626 m)     Head Circumference --      Peak Flow --      Pain Score 10/07/20 1413 3     Pain Loc --      Pain Edu? --      Excl. in [place]? --     Vital signs reviewed, nursing assessments reviewed.   Constitutional:   Alert and oriented. Non-toxic appearance. Eyes:   Conjunctivae are normal. EOMI. PERRL. ENT      Head:   Normocephalic and atraumatic.      Nose:   Wearing a mask.      Mouth/Throat:   Wearing a mask.      Neck:   No meningismus. Full ROM. Hematological/Lymphatic/Immunilogical:   No cervical lymphadenopathy. Cardiovascular:   RRR. Symmetric bilateral radial and DP pulses.  No murmurs. Cap refill less than 2 seconds. Respiratory:   Normal respiratory effort without tachypnea/retractions. Breath sounds are clear and equal bilaterally. No wheezes/rales/rhonchi. Gastrointestinal:   Soft and nontender.  No rebound, rigidity, or guarding. Musculoskeletal:   Normal range of motion in all extremities. Neurologic:   Normal speech and language.  Motor grossly intact. No acute focal neurologic deficits are appreciated.  Skin:    Skin is warm, dry and intact. No rash  noted.  No petechiae, purpura, or bullae.  ____________________________________________    LABS (pertinent positives/negatives) (all labs ordered are listed, but only abnormal results are displayed) Labs Reviewed  BASIC METABOLIC PANEL - Abnormal; Notable for the following components:      Result Value   Calcium 8.4 (*)    All other components within normal limits  CBC WITH DIFFERENTIAL/PLATELET - Abnormal; Notable for the following components:   Platelets 483 (*)    All other components within normal limits  POC URINE PREG, ED   ____________________________________________   EKG  ____________________________________________    RADIOLOGY  CT Angio Chest PE W and/or Wo Contrast  Result Date: 10/07/2020 CLINICAL DATA:  Short of breath. Positive home COVID-19 test. Positive D-dimer. EXAM: CT ANGIOGRAPHY CHEST WITH CONTRAST TECHNIQUE: Multidetector CT imaging of the chest was performed using the standard protocol during bolus administration of intravenous contrast. Multiplanar CT image reconstructions and MIPs were obtained to evaluate the vascular anatomy. CONTRAST:  35mL OMNIPAQUE IOHEXOL 350 MG/ML SOLN COMPARISON:  03/05/2011 FINDINGS: Cardiovascular: Study somewhat limited due to mild respiratory motion and inconsistent opacification of the smaller pulmonary arteries. Allowing for this, there is no convincing pulmonary embolism. Heart is normal in size and configuration. No pericardial effusion. No coronary artery calcifications. Great vessels are normal in caliber. No significant aortic atherosclerosis. No dissection Mediastinum/Nodes: No enlarged mediastinal, hilar, or axillary lymph nodes. Thyroid gland, trachea, and esophagus demonstrate no significant findings. Lungs/Pleura: Lungs are clear. No pleural effusion or pneumothorax. Upper Abdomen: Previous gastric stapling surgery. Visualized upper abdominal structures otherwise unremarkable. Musculoskeletal: No fracture or acute  finding.  No bone lesion. Review of the MIP images confirms the above findings. IMPRESSION: 1. No evidence of a pulmonary embolism. Study mildly limited as detailed above. 2. No acute findings. Lungs are clear. No CT evidence of pneumonia. Electronically Signed   By: Amie Portland M.D.   On: 10/07/2020 16:18    ____________________________________________   PROCEDURES Procedures  ____________________________________________  DIFFERENTIAL DIAGNOSIS   Covid pneumonitis, viral bronchitis, pleural effusion, pulmonary embolism  CLINICAL IMPRESSION / ASSESSMENT AND PLAN / ED COURSE  Medications ordered in the ED: Medications  sodium chloride 0.9 % bolus 1,000 mL (0 mLs Intravenous Stopped 10/07/20 1547)  iohexol (OMNIPAQUE) 350 MG/ML injection 75 mL (75 mLs Intravenous Contrast Given 10/07/20 1556)    Pertinent labs & imaging results that were available during my care of the patient were reviewed by me and considered in my medical decision making (see chart for details).  AILEEN AMORE was evaluated in Emergency Department on 10/07/2020 for the symptoms described in the history of present illness. She was evaluated in the context of the global COVID-19 pandemic, which necessitated consideration that the patient might be at risk for infection with the SARS-CoV-2 virus that causes COVID-19. Institutional protocols and algorithms that pertain to the evaluation of patients at risk for COVID-19 are in a state of rapid change based on information released by regulatory bodies including the CDC and federal and state organizations. These policies and algorithms were followed during the patient's care in the ED.   Patient presents with shortness of breath on exertion, overall mild symptoms attributable to Covid.  However, exogenous hormones increase risk of VTE, and a D-dimer was obtained by outpatient clinic which resulted at a very high level.  Will obtain a CT angiogram of the chest to evaluate for PE.   Will give fluids for hydration.  Screening creatinine and pregnancy test for CT.  If CT negative for PE, patient can be discharged home to continue supportive care with rest, hydration, NSAIDs.  She is nontoxic with stable vital signs.   ----------------------------------------- 4:24 PM on 10/07/2020 -----------------------------------------  CT imaging viewed by me. No obvious pna, edema, effusion, ptx, or embolism.  Rad report confirms CT negative, no PE or pneumonia.  Clear lungs.  Stable for discharge     ____________________________________________   FINAL CLINICAL IMPRESSION(S) / ED DIAGNOSES    Final diagnoses:  Shortness of breath  COVID-19 virus infection     ED Discharge Orders  None      Portions of this note were generated with dragon dictation software. Dictation errors may occur despite best attempts at proofreading.   Carrie Mew, MD 10/07/20 [phone]

## 2020-10-26 ENCOUNTER — Encounter (INDEPENDENT_AMBULATORY_CARE_PROVIDER_SITE_OTHER): Payer: Self-pay

## 2020-11-06 ENCOUNTER — Encounter (HOSPITAL_COMMUNITY): Payer: Self-pay | Admitting: *Deleted

## 2021-01-31 ENCOUNTER — Other Ambulatory Visit: Payer: Self-pay | Admitting: Obstetrics and Gynecology

## 2021-06-26 LAB — EXTERNAL GENERIC LAB PROCEDURE: COLOGUARD: NEGATIVE

## 2021-06-26 LAB — COLOGUARD: COLOGUARD: NEGATIVE

## 2021-11-06 ENCOUNTER — Encounter (HOSPITAL_COMMUNITY): Payer: Self-pay | Admitting: *Deleted

## 2022-05-13 ENCOUNTER — Encounter (INDEPENDENT_AMBULATORY_CARE_PROVIDER_SITE_OTHER): Payer: Self-pay

## 2023-10-05 ENCOUNTER — Other Ambulatory Visit: Payer: Self-pay | Admitting: Obstetrics and Gynecology

## 2023-10-05 DIAGNOSIS — Z1231 Encounter for screening mammogram for malignant neoplasm of breast: Secondary | ICD-10-CM

## 2023-10-19 ENCOUNTER — Ambulatory Visit: Payer: BC Managed Care – PPO

## 2023-10-20 ENCOUNTER — Ambulatory Visit: Payer: BC Managed Care – PPO

## 2023-10-21 ENCOUNTER — Ambulatory Visit
Admission: RE | Admit: 2023-10-21 | Discharge: 2023-10-21 | Disposition: A | Discharge status: Home / Self Care | Payer: BC Managed Care – PPO | Source: Ambulatory Visit | Attending: Obstetrics and Gynecology | Admitting: Obstetrics and Gynecology

## 2023-10-21 DIAGNOSIS — Z1231 Encounter for screening mammogram for malignant neoplasm of breast: Secondary | ICD-10-CM

## 2023-10-27 ENCOUNTER — Encounter (HOSPITAL_COMMUNITY): Payer: Self-pay | Admitting: *Deleted

## 2024-07-02 LAB — COLOGUARD: COLOGUARD: POSITIVE — AB

## 2024-11-03 ENCOUNTER — Encounter (HOSPITAL_COMMUNITY): Payer: Self-pay | Admitting: *Deleted
